# Patient Record
Sex: Female | Born: 2000 | Race: White | Hispanic: No | Marital: Single | Smoking: Never smoker
Health system: Southern US, Community
[De-identification: ages and names within clinical notes are randomized; demographics above are authoritative.]

## PROBLEM LIST (undated history)

## (undated) DIAGNOSIS — N946 Dysmenorrhea, unspecified: Secondary | ICD-10-CM

## (undated) DIAGNOSIS — F633 Trichotillomania: Secondary | ICD-10-CM

## (undated) DIAGNOSIS — J189 Pneumonia, unspecified organism: Secondary | ICD-10-CM

## (undated) DIAGNOSIS — F419 Anxiety disorder, unspecified: Secondary | ICD-10-CM

## (undated) DIAGNOSIS — R011 Cardiac murmur, unspecified: Secondary | ICD-10-CM

## (undated) DIAGNOSIS — D8989 Other specified disorders involving the immune mechanism, not elsewhere classified: Secondary | ICD-10-CM

## (undated) DIAGNOSIS — F32A Depression, unspecified: Secondary | ICD-10-CM

## (undated) DIAGNOSIS — F329 Major depressive disorder, single episode, unspecified: Secondary | ICD-10-CM

## (undated) HISTORY — DX: Other specified disorders involving the immune mechanism, not elsewhere classified: D89.89

## (undated) HISTORY — DX: Pneumonia, unspecified organism: J18.9

## (undated) HISTORY — DX: Cardiac murmur, unspecified: R01.1

## (undated) HISTORY — DX: Depression, unspecified: F32.A

## (undated) HISTORY — PX: NO PAST SURGERIES: SHX2092

## (undated) HISTORY — DX: Trichotillomania: F63.3

## (undated) HISTORY — PX: WISDOM TOOTH EXTRACTION: SHX21

## (undated) HISTORY — DX: Anxiety disorder, unspecified: F41.9

## (undated) HISTORY — DX: Dysmenorrhea, unspecified: N94.6

---
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
## 2020-01-10 ENCOUNTER — Ambulatory Visit (INDEPENDENT_AMBULATORY_CARE_PROVIDER_SITE_OTHER): Payer: PRIVATE HEALTH INSURANCE | Admitting: Psychiatry

## 2020-01-10 DIAGNOSIS — F411 Generalized anxiety disorder: Secondary | ICD-10-CM | POA: Diagnosis not present

## 2020-01-10 NOTE — Progress Notes (Signed)
[facility] Counselor/Therapist Progress Note  Patient ID: Casey Santana, MRN: [id number],    Date: 01/10/2020  Time Spent: 60 minutes   1:00pm to 2:00pm  Virtual Visit Note via Copywriter, advertising with patient by a video enabled telemedicine/telehealth application or telephone, with their informed consent, and verified patient privacy and that I am speaking with the correct person using two identifiers. I discussed the limitations, risks, security and privacy concerns of performing psychotherapy and management service by telephone and the availability of in person appointments. I also discussed with the patient that there may be a patient responsible charge related to this service. The patient expressed understanding and agreed to proceed. I discussed the treatment planning with the patient. The patient was provided an opportunity to ask questions and all were answered. The patient agreed with the plan and demonstrated an understanding of the instructions. The patient was advised to call  our office if  symptoms worsen or feel they are in a crisis state and need immediate contact.   Therapist Location: [facility] Patient Location: home   Treatment Type: Individual Therapy  Reported Symptoms: depression, anxiety, some lack of focus but thinks it's due to her allergies  Mental Status Exam:  Appearance:   Casual     Behavior:  Appropriate and Sharing  Motor:  Normal  Speech/Language:   Clear and Coherent  Affect:  anxious, depressed (lessened some)  Mood:  anxious and depressed  Thought process:  goal directed  Thought content:    WNL  Sensory/Perceptual disturbances:    WNL  Orientation:  oriented to person, place, time/date, situation, day of week, month of year and year  Attention:  Fair  Concentration:  Fair  Memory:  WNL  Fund of knowledge:   Good  Insight:    Good and Fair  Judgment:   Good and Fair  Impulse Control:  Good and Fair   Risk  Assessment: Danger to Self:  No Self-injurious Behavior: No Danger to Others: No Duty to Warn:no Physical Aggression / Violence:No  Access to Firearms a concern: No  Gang Involvement:No    Subjective: Patient reports anxiety and depression but feels she has tried to manage her thoughts and feelings better.  No incidents of "cutting self" since last 2 appts.  Interventions: Cognitive Behavioral Therapy and Solution-Oriented/Positive Psychology  Diagnosis:   ICD-10-CM   1. Generalized anxiety disorder  F41.1     Plan of Care: Patient not signing tx plan on computer screen due to Covid.  Treatment Goals: Goal remain on tx plan as patient works with strategies to achieve her goals. Progress is noted each visit in "Progress" section of Plan.  Long term goal: Develop healthy cognitive patterns and beliefs about self and the world that help alleviate depression and any thoughts to harm self, and also help prevent relapse of depression.  Short term goal: Identify and replace depressive thinking that leads to depressive feelings and actions.   Strategy: Identify depressive/anxious/negative thoughts and self-talk that lead to depression/anxiety and work interrupt those cognitions and replace with more reality-based, positive, and empowering thoughts and self-talk.  Progress: Patient reports anxiety and depression most recently with depression being the strongest, "but not as bad as it was previously." Denies any SI and has had no incidents of self-cutting since last 2 appts. States she is feeling more confident in her ability to stop the cutting as she describes her recent success.  Having some allergy issues more recently. Patient needing to  talk today about increased stress and anxiety on her job, some challenges with specific employees and explained her concerns. 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 It was a good sign to me that patient brought this up and she will be following through in working to stop it more quickly as she did successfully in the past with these behaviors.  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  Self-esteem she rates a 3-4 on a scale of 1-10, which remains good progress for patient when compared with her history of ratings.  Still smiling more and having more self-confidence in speaking up.  Is putting forth more effort and the progress is showing.  Reminded patient of something that came up in last session which was "progress is not linear", and encouraging her to feel good about the progress she is experiencing.  Patient to continue goal-directed behavior between sessions including positive self-care of looking more for the positives than the negatives, refraining from any self-cutting, increasing her belief in herself, maintaining contact with supportive people, identifying her strengths, getting outside some each day, healthy nutrition and exercise, staying in the present rather than jumping ahead to the future, focusing on what she can control versus cannot control, and using more positive self-talk.  Goal review and progress/challenges noted with patient.  Next appointment within 2-3 weeks.   Mathis Fare, LCSW

## 2020-01-24 ENCOUNTER — Other Ambulatory Visit: Payer: Self-pay

## 2020-01-24 ENCOUNTER — Other Ambulatory Visit (HOSPITAL_COMMUNITY)
Admission: RE | Admit: 2020-01-24 | Discharge: 2020-01-24 | Disposition: A | Discharge status: Home / Self Care | Payer: PRIVATE HEALTH INSURANCE | Source: Ambulatory Visit | Attending: Physician Assistant | Admitting: Physician Assistant

## 2020-01-24 ENCOUNTER — Encounter: Payer: Self-pay | Admitting: Physician Assistant

## 2020-01-24 ENCOUNTER — Ambulatory Visit (INDEPENDENT_AMBULATORY_CARE_PROVIDER_SITE_OTHER): Payer: No Typology Code available for payment source | Admitting: Physician Assistant

## 2020-01-24 ENCOUNTER — Ambulatory Visit (INDEPENDENT_AMBULATORY_CARE_PROVIDER_SITE_OTHER): Payer: PRIVATE HEALTH INSURANCE | Admitting: Psychiatry

## 2020-01-24 VITALS — BP 110/56 | HR 84 | Temp 97.9°F | Ht 65.0 in | Wt 128.4 lb

## 2020-01-24 DIAGNOSIS — N760 Acute vaginitis: Secondary | ICD-10-CM | POA: Diagnosis not present

## 2020-01-24 DIAGNOSIS — F411 Generalized anxiety disorder: Secondary | ICD-10-CM | POA: Diagnosis not present

## 2020-01-24 NOTE — Progress Notes (Signed)
[facility] Counselor/Therapist Progress Note  Patient ID: Casey Santana, MRN: [id number],    Date: 01/24/2020  Time Spent: 60 minutes   1:00pm to 2:00pm  Treatment Type: Individual Therapy  Reported Symptoms: anxiety, depression. States anxiety is the stronger symptom.  Mental Status Exam:  Appearance:   Casual     Behavior:  Appropriate, Sharing and Motivated  Motor:  Normal  Speech/Language:   Clear and Coherent  Affect:  anxious, depression  Mood:  anxious  Thought process:  goal directed  Thought content:    WNL  Sensory/Perceptual disturbances:    WNL  Orientation:  oriented to person, place, time/date, situation, day of week, month of year and year  Attention:  Fair  Concentration:  Fair  Memory:  WNL  Fund of knowledge:   Good  Insight:    Fair  Judgment:   Fair  Impulse Control:  Fair   Risk Assessment: Danger to Self:  No Self-injurious Behavior: No Danger to Others: No Duty to Warn:no Physical Aggression / Violence:No  Access to Firearms a concern: No  Gang Involvement:No   Subjective: Patient today reporting anxiety and depression. Busy in her job with more stress, as she got a job that involve more responsibility and management.   Interventions: Cognitive Behavioral Therapy and Solution-Oriented/Positive Psychology  Diagnosis:   ICD-10-CM   1. Generalized anxiety disorder  F41.1      Plan of Care: Patient not signing tx plan on computer screen due to Covid.  Treatment Goals: Goal remain on tx plan as patient works with strategies to achieve her goals. Progress is noted each visit in "Progress" section of Plan.  Long term goal: Develop healthy cognitive patterns and beliefs about self and the world that help alleviate depression/anxiety and any thoughts to harm self, and also help prevent relapse of depression.  Short term goal: Identify and replace depressive thinking that leads to depressive/anxious feelings and actions.    Strategy: Identify depressive/anxious/negative thoughts and self-talk that lead to depression/anxiety and work interrupt those cognitions and replace with more reality-based, positive, and empowering thoughts and self-talk.  Progress: Patient in today reporting anxiety and depression, with anxiety being the strongest. Currently her anxiety has gotten worse due to work and family situations.  Patient processed some of the family situations, her anxious thoughts and feelings about them, and we looked at some potential ways of her better managing what she feels is her part in those situations.  (Not all details shared in this note due to patient privacy needs.)  Patient has been increasingly more open about some of her own personal issues and also how they impact the family and patient.  She states that her depression has decreased some since last appointment.  With her anxiety having increased recently due to personal and family issues, we worked with her short-term goal and strategy and treatment plan above, aimed at more quickly interrupting and challenging her anxious thoughts and self talk, then replacing them with more realistic, encouraging, and empowering thoughts and self talk.  She rated her self-esteem today as a definite for on a scale of 1-10 which is a little better than last session.  Observed more smiling occasionally today even though we were talking about some sensitive information.  She currently denies any SI and also denies any incidents of self-cutting since last appointment.  This translates into her being free of any self-cutting for over the past 3 appointments. Seems to be feeling more confident in herself  and sharing information more openly.  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  She is to continue working with  specially and also behaviors involving positive self-care, including focus what she can control versus cannot, healthy nutrition and exercise, having contact with supportive people often, being able to identify and use her strengths more, making her self- talk more positive, looking for more positives than negatives each day, working on her self-esteem and belief in herself, staying in the present, and continue to refrain from any self-cutting.  Goal review and progress/challenges noted with patient.  Next appointment within 3 weeks.   Mathis Fare, LCSW

## 2020-01-24 NOTE — Patient Instructions (Signed)
It was great to see you!  I will be in touch with your swab results as soon as they have returned.  Take care,  Jarold Motto PA-C

## 2020-01-24 NOTE — Progress Notes (Signed)
Casey Santana is a 19 y.o. adult here for a new problem.  I acted as a Neurosurgeon for [employer], PA-C Corky Mull, LPN   History of Present Illness:   Chief Complaint  Patient presents with  . Vaginal Discharge    HPI   Vaginal discharge Pt c/o white vaginal discharge with external itching x 10 days. Has not tried anything OTC. Denies odor, pelvic pain, pain with sex, partner with similar symptoms.  She is sexually active with one female partner, uses condoms.  Patient's last menstrual period was 12/30/2019.   Past Medical History:  Diagnosis Date  . Anxiety   . Depression   . Dysmenorrhea   . Heart murmur      Social History   Tobacco Use  . Smoking status: Never Smoker  . Smokeless tobacco: Never Used  Vaping Use  . Vaping Use: Never used  Substance Use Topics  . Alcohol use: No  . Drug use: No    Past Surgical History:  Procedure Laterality Date  . NO PAST SURGERIES      Family History  Problem Relation Age of Onset  . Arthritis Maternal Grandmother   . Hearing loss Maternal Grandmother   . Hyperlipidemia Maternal Grandmother   . Hypertension Maternal Grandmother   . Anxiety disorder Maternal Grandmother   . Arthritis Maternal Grandfather   . Cancer Maternal Grandfather   . Depression Maternal Grandfather   . Hearing loss Maternal Grandfather   . Hyperlipidemia Maternal Grandfather   . Hyperlipidemia Paternal Grandmother   . Hyperlipidemia Paternal Grandfather   . Mental retardation Mother        bipolar disorder  . Arthritis Mother   . Hyperlipidemia Father   . Bipolar disorder Other   . Schizophrenia Other   . Migraines Neg Hx   . Seizures Neg Hx   . Autism Neg Hx   . ADD / ADHD Neg Hx     Allergies  Allergen Reactions  . Peanut-Containing Drug Products     Current Medications:   Current Outpatient Medications:  .  amphetamine-dextroamphetamine (ADDERALL) 30 MG tablet, Take 1 tablet by mouth daily., Disp: 30 tablet, Rfl: 0 .   EPINEPHrine (EPIPEN 2-PAK) 0.3 mg/0.3 mL IJ SOAJ injection, Inject 0.3 mLs (0.3 mg total) into the muscle as needed for anaphylaxis., Disp: 1 each, Rfl: 2 .  propranolol (INDERAL) 10 MG tablet, Take 1 tablet (10 mg total) by mouth 2 (two) times daily., Disp: 60 tablet, Rfl: 5   Review of Systems:   ROS  Negative unless otherwise specified per HPI.  Vitals:   Vitals:   01/24/20 0801  BP: (!) 110/56  Pulse: 84  Temp: 97.9 F (36.6 C)  TempSrc: Temporal  SpO2: 97%  Weight: 128 lb 6.1 oz (58.2 kg)  Height: 5\' 5"  (1.651 m)     Body mass index is 21.36 kg/m.  Physical Exam:   Physical Exam Chaperone present: , LPN.  Constitutional:      Appearance: Casey Row "Micah" is well-developed.  HENT:     Head: Normocephalic and atraumatic.  Eyes:     Conjunctiva/sclera: Conjunctivae normal.  Pulmonary:     Effort: Pulmonary effort is normal.  Genitourinary:    Vagina: Vaginal discharge (yellow/green, thick) present.     Cervix: Normal.  Musculoskeletal:        General: Normal range of motion.     Cervical back: Normal range of motion and neck supple.  Skin:    General: Skin is warm  and dry.  Neurological:     Mental Status: Casey Ruelas "Micah" is alert and oriented to person, place, and time.  Psychiatric:        Behavior: Behavior normal.        Thought Content: Thought content normal.        Judgment: Judgment normal.     Assessment and Plan:   Casey Santana was seen today for vaginal discharge.  Diagnoses and all orders for this visit:  Acute vaginitis -     Cervicovaginal ancillary only    Vaginal swab sent off for evaluation. Recommend avoiding sex until results have returned. Will recommend appropriate treatment once results have returned.   CMA or LPN served as scribe during this visit. History, Physical, and Plan performed by medical provider. The above documentation has been reviewed and is accurate and complete.   Casey Motto, PA-C

## 2020-01-25 ENCOUNTER — Other Ambulatory Visit: Payer: Self-pay | Admitting: Physician Assistant

## 2020-01-25 LAB — CERVICOVAGINAL ANCILLARY ONLY
Bacterial Vaginitis (gardnerella): NEGATIVE
Candida Glabrata: NEGATIVE
Candida Vaginitis: POSITIVE — AB
Chlamydia: NEGATIVE
Comment: NEGATIVE
Comment: NEGATIVE
Comment: NEGATIVE
Comment: NEGATIVE
Comment: NEGATIVE
Comment: NORMAL
Neisseria Gonorrhea: NEGATIVE
Trichomonas: NEGATIVE

## 2020-01-25 MED ORDER — FLUCONAZOLE 150 MG PO TABS
150.0000 mg | ORAL_TABLET | Freq: Once | ORAL | 1 refills | Status: AC
Start: 2020-01-25 — End: 2020-01-25

## 2020-02-06 ENCOUNTER — Ambulatory Visit (INDEPENDENT_AMBULATORY_CARE_PROVIDER_SITE_OTHER): Payer: PRIVATE HEALTH INSURANCE | Admitting: Adult Health

## 2020-02-06 ENCOUNTER — Encounter: Payer: Self-pay | Admitting: Adult Health

## 2020-02-06 ENCOUNTER — Other Ambulatory Visit: Payer: Self-pay

## 2020-02-06 VITALS — BP 100/61 | HR 90

## 2020-02-06 DIAGNOSIS — F909 Attention-deficit hyperactivity disorder, unspecified type: Secondary | ICD-10-CM | POA: Diagnosis not present

## 2020-02-06 DIAGNOSIS — F331 Major depressive disorder, recurrent, moderate: Secondary | ICD-10-CM | POA: Diagnosis not present

## 2020-02-06 DIAGNOSIS — F411 Generalized anxiety disorder: Secondary | ICD-10-CM | POA: Diagnosis not present

## 2020-02-06 MED ORDER — AMPHETAMINE-DEXTROAMPHETAMINE 30 MG PO TABS: 30.0000 mg | ORAL_TABLET | Freq: Every day | ORAL | 0 refills | Status: DC

## 2020-02-06 NOTE — Progress Notes (Signed)
Casey Santana [id number] 2001/02/28 19 y.o.  Subjective:   Patient ID:  Casey Santana is a 19 y.o. (DOB May 11, 2000) adult.  Chief Complaint: No chief complaint on file.   HPI Casey Santana presents to the office today for follow-up of MDD, ADHD, and GAD.  Describes mood today as "ok". Mood symptoms - reports decreased depression, anxiety, and irritability. Stating "I'm doing alright". Feels like change back  to Adderall IR has been a better fit. Stating "I feel like it lets me focus on things". Doing well in work setting - recent promotion. Seeing therapist Rockne Menghini. Improved interest and motivation. Taking medications as prescribed.  Energy levels stable. Active, does not have a regular exercise routine.  Enjoys some usual interests and activities. Single. Lives at home with parents and 2 dogs - "Delphos and Perlie Gold". . Has a 43 year old brother. Spending time with family.  Appetite adequate. Weight stable - 130 pounds. Sleeps well most nights. Averages 7 to 8 hours. Focus and concentration is "decent". Completing tasks. Managing aspects of household. Work going well. Works 25 to 30 hours a week at [employer].  Denies SI or HI.  Denies AH or VH. Denies substance use, over spending, or reckless behaviors.   Previous medication trials: Lexapro, Prozac, Propanolol, Cymbalta  GAD-7     Office Visit from 02/13/2019 in [facility] Visit from 02/24/2018 in [facility]  Total GAD-7 Score 14 9    PHQ2-9     Office Visit from 05/12/2019 in [facility] Visit from 04/27/2019 in [facility] Visit from 02/13/2019 in [facility] from 02/24/2018 in [facility] Office Visit from 11/27/2016 in [facility]  PHQ-2 Total Score 4 4 2 3 2   PHQ-9 Total Score 19 20 19 11 4         Review of Systems:  Review of Systems  Musculoskeletal: Negative for gait problem.  Neurological: Negative for tremors.  Psychiatric/Behavioral:       Please refer to HPI    Medications: I have reviewed the patient's current medications.  Current Outpatient Medications  Medication Sig Dispense Refill   amphetamine-dextroamphetamine (ADDERALL) 30 MG tablet Take 1 tablet by mouth daily. 30 tablet 0   EPINEPHrine (EPIPEN 2-PAK) 0.3 mg/0.3 mL IJ SOAJ injection Inject 0.3 mLs (0.3 mg total) into the muscle as needed for anaphylaxis. 1 each 2   propranolol (INDERAL) 10 MG tablet Take 1 tablet (10 mg total) by mouth 2 (two) times daily. 60 tablet 5   No current facility-administered medications for this visit.    Medication Side Effects: None  Allergies:  Allergies  Allergen Reactions   Peanut-Containing Drug Products     Past Medical History:  Diagnosis Date   Anxiety    Depression    Dysmenorrhea    Heart murmur     Family History  Problem Relation Age of Onset   Arthritis Maternal Grandmother    Hearing loss Maternal Grandmother    Hyperlipidemia Maternal Grandmother    Hypertension Maternal Grandmother    Anxiety disorder Maternal Grandmother    Arthritis Maternal Grandfather    Cancer Maternal Grandfather    Depression Maternal Grandfather    Hearing loss Maternal Grandfather    Hyperlipidemia Maternal Grandfather    Hyperlipidemia Paternal Grandmother    Hyperlipidemia Paternal Grandfather    Mental retardation Mother        bipolar disorder  Arthritis Mother    Hyperlipidemia Father    Bipolar disorder Other    Schizophrenia Other    Migraines Neg Hx    Seizures Neg Hx    Autism Neg Hx    ADD / ADHD Neg Hx     Social History   Socioeconomic History   Marital status: Single    Spouse name: Not on file   Number of children: Not on file   Years of education: Not on file   Highest education level: Not on file   Occupational History   Not on file  Tobacco Use   Smoking status: Never Smoker   Smokeless tobacco: Never Used  Vaping Use   Vaping Use: Never used  Substance and Sexual Activity   Alcohol use: No   Drug use: No   Sexual activity: Yes    Birth control/protection: Condom  Other Topics Concern   Not on file  Social History Narrative   Lives with mom, dad and brother when he comes home from college. She plans on attending [employer]   Social Determinants of Health   Financial Resource Strain:    Difficulty of Paying Living Expenses: Not on file  Food Insecurity:    Worried About Programme researcher, broadcasting/film/video in the Last Year: Not on file   [employer] of Food in the Last Year: Not on file  Transportation Needs:    Lack of Transportation (Medical): Not on file   Lack of Transportation (Non-Medical): Not on file  Physical Activity:    Days of Exercise per Week: Not on file   Minutes of Exercise per Session: Not on file  Stress:    Feeling of Stress : Not on file  Social Connections:    Frequency of Communication with Friends and Family: Not on file   Frequency of Social Gatherings with Friends and Family: Not on file   Attends Religious Services: Not on file   Active Member of Clubs or Organizations: Not on file   Attends Banker Meetings: Not on file   Marital Status: Not on file  Intimate Partner Violence:    Fear of Current or Ex-Partner: Not on file   Emotionally Abused: Not on file   Physically Abused: Not on file   Sexually Abused: Not on file    Past Medical History, Surgical history, Social history, and Family history were reviewed and updated as appropriate.   Please see review of systems for further details on the patient's review from today.   Objective:   Physical Exam:  There were no vitals taken for this visit.  Physical Exam Constitutional:      General: Casey Santana "Casey Santana" is not in acute distress. Musculoskeletal:         General: No deformity.  Neurological:     Mental Status: Casey Santana "Casey Santana" is alert and oriented to person, place, and time.     Coordination: Coordination normal.  Psychiatric:        Attention and Perception: Attention and perception normal. Trent Juday "Casey Santana" does not perceive auditory or visual hallucinations.        Mood and Affect: Mood normal. Mood is not anxious or depressed. Affect is not labile, blunt, angry or inappropriate.        Speech: Speech normal.        Behavior: Behavior normal.        Thought Content: Thought content normal. Thought content is not paranoid or delusional. Thought content does not include homicidal  or suicidal ideation. Thought content does not include homicidal or suicidal plan.        Cognition and Memory: Cognition and memory normal.        Judgment: Judgment normal.     Comments: Insight intact     Lab Review:     Component Value Date/Time   NA 137 02/13/2019 1117   K 3.9 02/13/2019 1117   CL 102 02/13/2019 1117   CO2 26 02/13/2019 1117   GLUCOSE 93 02/13/2019 1117   BUN 9 02/13/2019 1117   CREATININE 0.68 02/13/2019 1117   CALCIUM 9.6 02/13/2019 1117       Component Value Date/Time   WBC 3.8 (L) 02/13/2019 1117   RBC 4.53 02/13/2019 1117   HGB 13.8 02/13/2019 1117   HCT 40.9 02/13/2019 1117   PLT 219.0 02/13/2019 1117   MCV 90.4 02/13/2019 1117   MCHC 33.6 02/13/2019 1117   RDW 12.9 02/13/2019 1117   LYMPHSABS 1.6 02/13/2019 1117   MONOABS 0.3 02/13/2019 1117   EOSABS 0.1 02/13/2019 1117   BASOSABS 0.1 02/13/2019 1117    No results found for: POCLITH, LITHIUM   No results found for: PHENYTOIN, PHENOBARB, VALPROATE, CBMZ   .res Assessment: Plan:    Plan:  PDMP reviewed  1. Adderall 30mg  daily. 2. Propanolol 10mg  BID for anxiety  40/58 Attention Deficit Disorder likely Meets DSM 5 criteria for ADHD  RTC 4 weeks  Continue therapy with  Patient advised to contact office with any questions,  adverse effects, or acute worsening in signs and symptoms.  Greater than 50% of face to face time with patient was spent on counseling and coordination of care. We discussed medications, diet, vitamin supplementation, and therapy. Advised to review the ADD[employer] website.    Diagnoses and all orders for this visit:  Generalized anxiety disorder  Attention deficit hyperactivity disorder (ADHD), unspecified ADHD type  Major depressive disorder, recurrent episode, moderate (HCC)     Please see After Visit Summary for patient specific instructions.  Future Appointments  Date Time Provider Department Center  02/07/2020  1:00 PM Rockne Menghini, LCSW [facility] None  02/28/2020  3:00 PM Mathis Fare, LCSW [facility] None  03/20/2020  1:00 PM Mathis Fare, LCSW [facility] None  05/23/2020  8:30 AM Mathis Fare, MD [facility]    No orders of the defined types were placed in this encounter.   -------------------------------

## 2020-02-07 ENCOUNTER — Ambulatory Visit (INDEPENDENT_AMBULATORY_CARE_PROVIDER_SITE_OTHER): Payer: PRIVATE HEALTH INSURANCE | Admitting: Psychiatry

## 2020-02-07 DIAGNOSIS — F411 Generalized anxiety disorder: Secondary | ICD-10-CM | POA: Diagnosis not present

## 2020-02-07 NOTE — Progress Notes (Signed)
[facility] Counselor/Therapist Progress Note  Patient ID: Casey Santana, MRN: [id number],    Date: 02/07/2020  Time Spent: 50 minutes  1:00pm to 1:50pm   Treatment Type: Individual Therapy  Reported Symptoms: anxiety, depression, lack of focus but "often forgets to take my ADHD meds"  Mental Status Exam:  Appearance:   Casual     Behavior:  Appropriate, Sharing and Motivated  Motor:  Normal  Speech/Language:   Clear and Coherent  Affect:  anxious  Mood:  anxious/depressed  Thought process:  goal directed  Thought content:    WNL  Sensory/Perceptual disturbances:    WNL  Orientation:  oriented to person, place, time/date, situation, day of week, month of year and year  Attention:  Good  Concentration:  Good and Fair  Memory:  WNL  Fund of knowledge:   Good  Insight:    Fair  Judgment:   Fair  Impulse Control:  Fair   Risk Assessment: Danger to Self:  No Self-injurious Behavior: No Danger to Others: No Duty to Warn:no Physical Aggression / Violence:No  Access to Firearms a concern: No  Gang Involvement:No   Subjective: Patient today reports anxiety and some focus issues but admits she's missed some of her ADHD meds.  Interventions: Cognitive Behavioral Therapy and Solution-Oriented/Positive Psychology  Diagnosis:   ICD-10-CM   1. Generalized anxiety disorder  F41.1      Plan of Care: Patient not signing tx plan on computer screen due to Covid.  Treatment Goals: Goal remain on tx plan as patient works with strategies to achieve her goals. Progress is noted each visit in "Progress" section of Plan.  Long term goal: Develop healthy cognitive patterns and beliefs about self and the world that help alleviate depression/anxiety and any thoughts to harm self, and also help prevent relapse of depression.  Short term goal: Identify and replace depressive thinking that leads to depressive/anxious feelings and actions.   Strategy: Identify  depressive/anxious/negative thoughts and self-talk that lead to depression/anxiety and work interrupt those cognitions and replace with more reality-based, positive, and empowering thoughts and self-talk.  Progress: Patient in today reporting anxiety, some depression although it has decreased a little more, and lack of focus at times due to forgetting her ADHD meds.  Has continued to not do any cutting on herself which is really good for her.  Seems to feel positive about this as this is the longest she has gone in quite a while without cutting, per her report.  Discussed the need to be more regular with the ADHD meds and she agrees. Concerned and very anxious about not knowing what she wants to do as a career despite talking to several people. Also very sensitive about speaking with family about a very personal issue and she is afraid of their response and possible judgement or insensitivity. Patient was able to be quite open about the the career issue and the more personal issue today. Processed the personal issue and her fears about parent's reactions (Not all information included in this note due to patient privacy concerns.)  Patient did seem less anxious and more grounded after talking through the personal issue more and has some tentative plans for addressing it with her parents soon.  Self-esteem rating today on 1-10 scale is a "4-5".  We also discussed the career issue and her wanting to determine where her interest and strengths lie, and determine a plan of action.  Was able to come up with some steps that might  help her and getting answers to these issues and also exploring the issue of talking with a career specialist.  Some increased confidence noted in patient.  Patient encouraged to continue maintaining contact with supportive people, healthy nutrition and exercise, start taking her ADHD meds consistently, using encouraging self talk, focus on what she can control or impact versus what she cannot,  clearer identification of her strengths and using them more, working to elevate her self-esteem and belief in herself more, staying in the present, looking for more positives and negatives every day, and continuing to refrain from any self cutting.  We will follow-up on issues from today again at next appointment.  Goal review and progress/challenges noted with patient.  Next appointment within 3 weeks.   Mathis Fare, LCSW

## 2020-02-28 ENCOUNTER — Ambulatory Visit: Payer: PRIVATE HEALTH INSURANCE | Admitting: Psychiatry

## 2020-03-12 ENCOUNTER — Ambulatory Visit: Payer: PRIVATE HEALTH INSURANCE | Admitting: Adult Health

## 2020-03-13 ENCOUNTER — Encounter: Payer: Self-pay | Admitting: Adult Health

## 2020-03-14 ENCOUNTER — Other Ambulatory Visit: Payer: Self-pay

## 2020-03-14 DIAGNOSIS — Z20822 Contact with and (suspected) exposure to covid-19: Secondary | ICD-10-CM

## 2020-03-17 LAB — NOVEL CORONAVIRUS, NAA: SARS-CoV-2, NAA: NOT DETECTED

## 2020-03-20 ENCOUNTER — Other Ambulatory Visit: Payer: Self-pay

## 2020-03-20 ENCOUNTER — Ambulatory Visit (INDEPENDENT_AMBULATORY_CARE_PROVIDER_SITE_OTHER): Payer: BC Managed Care – PPO | Admitting: Psychiatry

## 2020-03-20 DIAGNOSIS — F411 Generalized anxiety disorder: Secondary | ICD-10-CM | POA: Diagnosis not present

## 2020-03-20 NOTE — Progress Notes (Signed)
[facility] Counselor/Therapist Progress Note  Patient ID: SOLSTICE LASTINGER, MRN: [id number],    Date: 03/20/2020  Time Spent: 60 minutes   1:00pm to 2:00pm  Treatment Type: Individual Therapy  Reported Symptoms: anxiety, some depression, family issues  Mental Status Exam:  Appearance:   Casual     Behavior:  Appropriate, Sharing and Motivated  Motor:  Normal  Speech/Language:   Normal Rate  Affect:  anxious, some depression  Mood:  anxious and depressed  Thought process:  normal  Thought content:    WNL  Sensory/Perceptual disturbances:    WNL  Orientation:  oriented to person, place, time/date, situation, day of week, month of year and year  Attention:  Fair  Concentration:  Fair  Memory:  WNL  Fund of knowledge:   Good  Insight:    Fair  Judgment:   Fair  Impulse Control:  Fair   Risk Assessment: Danger to Self:  No Self-injurious Behavior: No Danger to Others: No Duty to Warn:no Physical Aggression / Violence:No  Access to Firearms a concern: No  Gang Involvement:No   Subjective: Patient today reports anxiety and some depression.  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   Some family issues Covid exposures.  Interventions: Cognitive Behavioral Therapy and Solution-Oriented/Positive Psychology  Diagnosis:   ICD-10-CM   1. Generalized anxiety disorder  F41.1      Plan of Care: Patient not signing tx plan on computer screen due to Covid.  Treatment Goals: Goal remain on tx plan as patient works with strategies to achieve her goals. Progress is noted each visit in "Progress" section of Plan.  Long term goal: Develop healthy cognitive patterns and beliefs about self and the world that help alleviate depression/anxietyand any thoughts to harm self, and also help prevent relapse of depression.  Short term goal: Identify and replace depressive thinking that leads to depressive/anxiousfeelings and actions.   Strategy: Identify  depressive/anxious/negative thoughts and self-talk that lead to depression/anxiety and work interrupt those cognitions and replace with more reality-based, positive, and empowering thoughts and self-talk.  Progress: Patient today reports anxiety and some depression and family issues over holidays. Work concerns. Admits to some thoughts of cutting but did not act on any of them." Playing games with friends on the phone helps distract from those thoughts."  Talked about this being the longest stretch of time that she has had in refraining from cutting, which is 99 days very proud of it.  States that she has not had any recent thoughts of cutting self and there seems to be more time in between each occurrence of those thoughts.  Commits to continue to work on this and not to be cutting herself.  Also commits to giving updates each session.Marland Kitchen Has been working on and shared a letter she is preparing for her parents re: "who I am, identity issues" Shared her lengthy, very carefully and respectfully written letter in session. Letter was very sensitive for patient and also with a lot of sensitivity for her parents. Patient became more emotional as she shared her letter today.  Processed her many thoughts and feelings she has had while formulating the letter and to this point. Anxiety has risen, and dealing with nervousness and the uncertainties involved in sharing letter with parents. She does feel she is doing the right thing and is handling this in the best way she knows.  She acknowledges that a lot of her anxiety and depression is related to her  identity issues and the internal conflicts that she has felt, and also the aloneness she has felt within the family.  Noticeable increase in her self-confidence and willingness to take a stand for herself, and a very respectful way with her parents, that may lead to much greater self acceptance and self-esteem for patient.  Courage patient to practice positive and affirming  self talk, remain on all medications as prescribed, stay in the present and focus on what she can control versus cannot control, believing in herself more, continue to refrain from any self cutting, be more intentional and looking for positives every day, feel good about the strength she is showing in some of her behaviors, and remain in contact with people that are supportive of her.  Goal review and progress/challenges noted with patient.  Next appointment within 2 weeks.   Mathis Fare, LCSW

## 2020-03-22 ENCOUNTER — Ambulatory Visit (INDEPENDENT_AMBULATORY_CARE_PROVIDER_SITE_OTHER): Payer: BC Managed Care – PPO | Admitting: Adult Health

## 2020-03-22 ENCOUNTER — Encounter: Payer: Self-pay | Admitting: Adult Health

## 2020-03-22 ENCOUNTER — Other Ambulatory Visit: Payer: Self-pay

## 2020-03-22 DIAGNOSIS — F411 Generalized anxiety disorder: Secondary | ICD-10-CM | POA: Diagnosis not present

## 2020-03-22 DIAGNOSIS — F909 Attention-deficit hyperactivity disorder, unspecified type: Secondary | ICD-10-CM | POA: Diagnosis not present

## 2020-03-22 DIAGNOSIS — F331 Major depressive disorder, recurrent, moderate: Secondary | ICD-10-CM | POA: Diagnosis not present

## 2020-03-22 MED ORDER — PROPRANOLOL HCL 10 MG PO TABS: 10.0000 mg | ORAL_TABLET | Freq: Two times a day (BID) | ORAL | 5 refills | Status: DC

## 2020-03-22 MED ORDER — AMPHETAMINE-DEXTROAMPHETAMINE 30 MG PO TABS: 30.0000 mg | ORAL_TABLET | Freq: Every day | ORAL | 0 refills | Status: DC

## 2020-03-22 NOTE — Progress Notes (Signed)
Casey Santana [id number] 12-07-2000 20 y.o.  Subjective:   Patient ID:  Casey Santana is a 20 y.o. (DOB August 19, 2000) adult.  Chief Complaint: No chief complaint on file.   HPI Casey Santana presents to the office today for follow-up of MDD, ADHD, and GAD.  Describes mood today as "ok". Mood symptoms - reports decreased depression, anxiety, and irritability. Stating "I'm doing good". Feels like medications are working well. Doing well in work setting - works closing shifts. Seeing therapist [name]. Improved interest and motivation. Taking medications as prescribed.  Energy levels stable. Active, does not have a regular exercise routine.  Enjoys some usual interests and activities. Single. Lives at home with parents and 2 dogs - "Le Grand and Joneen Caraway". Has a 68 year old brother. Spending time with family.  Appetite adequate. Weight stable - 130 pounds. Sleeps well most nights. Averages 8 to 9 hours. Focus and concentration stable. Completing tasks. Managing aspects of household. Work going well. Works 25 to 30 hours a week at [employer].  Denies SI or HI.  Denies AH or VH. Denies substance use, over spending, or reckless behaviors.   Previous medication trials: Lexapro, Prozac, Propanolol, Cymbalta    GAD-7   Flowsheet Row Office Visit from 02/13/2019 in [facility] Visit from 02/24/2018 in [facility]  Total GAD-7 Score 14 9    PHQ2-9   [place] Office Visit from 05/12/2019 in [facility] Visit from 04/27/2019 in [facility] Visit from 02/13/2019 in [facility] Visit from 02/24/2018 in [facility] Visit from 11/27/2016 in [facility]  PHQ-2 Total Score 4 4 2 3 2   PHQ-9 Total Score 19 20 19 11 4        Review of Systems:  Review of Systems   Musculoskeletal: Negative for gait problem.  Neurological: Negative for tremors.  Psychiatric/Behavioral:       Please refer to HPI    Medications: I have reviewed the patient's current medications.  Current Outpatient Medications  Medication Sig Dispense Refill  . [START ON 05/17/2020] amphetamine-dextroamphetamine (ADDERALL) 30 MG tablet Take 1 tablet by mouth daily. 30 tablet 0  . amphetamine-dextroamphetamine (ADDERALL) 30 MG tablet Take 1 tablet by mouth daily. 30 tablet 0  . [START ON 04/19/2020] amphetamine-dextroamphetamine (ADDERALL) 30 MG tablet Take 1 tablet by mouth daily. 30 tablet 0  . EPINEPHrine (EPIPEN 2-PAK) 0.3 mg/0.3 mL IJ SOAJ injection Inject 0.3 mLs (0.3 mg total) into the muscle as needed for anaphylaxis. 1 each 2  . propranolol (INDERAL) 10 MG tablet Take 1 tablet (10 mg total) by mouth 2 (two) times daily. 60 tablet 5   No current facility-administered medications for this visit.    Medication Side Effects: None  Allergies:  Allergies  Allergen Reactions  . Peanut-Containing Drug Products     Past Medical History:  Diagnosis Date  . Anxiety   . Depression   . Dysmenorrhea   . Heart murmur     Family History  Problem Relation Age of Onset  . Arthritis Maternal Grandmother   . Hearing loss Maternal Grandmother   . Hyperlipidemia Maternal Grandmother   . Hypertension Maternal Grandmother   . Anxiety disorder Maternal Grandmother   . Arthritis Maternal Grandfather   . Cancer Maternal Grandfather   . Depression Maternal Grandfather   . Hearing loss Maternal Grandfather   . Hyperlipidemia Maternal Grandfather   .  Hyperlipidemia Paternal Grandmother   . Hyperlipidemia Paternal Grandfather   . Mental retardation Mother        bipolar disorder  . Arthritis Mother   . Hyperlipidemia Father   . Bipolar disorder Other   . Schizophrenia Other   . Migraines Neg Hx   . Seizures Neg Hx   . Autism Neg Hx   . ADD / ADHD Neg Hx     Social History    Socioeconomic History  . Marital status: Single    Spouse name: Not on file  . Number of children: Not on file  . Years of education: Not on file  . Highest education level: Not on file  Occupational History  . Not on file  Tobacco Use  . Smoking status: Never Smoker  . Smokeless tobacco: Never Used  Vaping Use  . Vaping Use: Never used  Substance and Sexual Activity  . Alcohol use: No  . Drug use: No  . Sexual activity: Yes    Birth control/protection: Condom  Other Topics Concern  . Not on file  Social History Narrative   ** Merged History Encounter **       Lives with mom, dad and brother when he comes home from college. She plans on attending [employer]   Social Determinants of Health   Financial Resource Strain: Not on file  Food Insecurity: Not on file  Transportation Needs: Not on file  Physical Activity: Not on file  Stress: Not on file  Social Connections: Not on file  Intimate Partner Violence: Not on file    Past Medical History, Surgical history, Social history, and Family history were reviewed and updated as appropriate.   Please see review of systems for further details on the patient's review from today.   Objective:   Physical Exam:  There were no vitals taken for this visit.  Physical Exam Constitutional:      General: [name] is not in acute distress. Musculoskeletal:        General: No deformity.  Neurological:     Mental Status: Casey Santana is alert and oriented to person, place, and time.     Coordination: Coordination normal.  Psychiatric:        Attention and Perception: Attention and perception normal. Casey Santana does not perceive auditory or visual hallucinations.        Mood and Affect: Mood normal. Mood is not anxious or depressed. Affect is not labile, blunt, angry or inappropriate.        Speech: Speech normal.        Behavior: Behavior normal.        Thought Content: Thought content normal. Thought content  is not paranoid or delusional. Thought content does not include homicidal or suicidal ideation. Thought content does not include homicidal or suicidal plan.        Cognition and Memory: Cognition and memory normal.        Judgment: Judgment normal.     Comments: Insight intact     Lab Review:     Component Value Date/Time   NA 137 02/13/2019 1117   K 3.9 02/13/2019 1117   CL 102 02/13/2019 1117   CO2 26 02/13/2019 1117   GLUCOSE 93 02/13/2019 1117   BUN 9 02/13/2019 1117   CREATININE 0.68 02/13/2019 1117   CALCIUM 9.6 02/13/2019 1117       Component Value Date/Time   WBC 3.8 (L) 02/13/2019 1117   RBC 4.53 02/13/2019 1117  HGB 13.8 02/13/2019 1117   HCT 40.9 02/13/2019 1117   PLT 219.0 02/13/2019 1117   MCV 90.4 02/13/2019 1117   MCHC 33.6 02/13/2019 1117   RDW 12.9 02/13/2019 1117   LYMPHSABS 1.6 02/13/2019 1117   MONOABS 0.3 02/13/2019 1117   EOSABS 0.1 02/13/2019 1117   BASOSABS 0.1 02/13/2019 1117    No results found for: POCLITH, LITHIUM   No results found for: PHENYTOIN, PHENOBARB, VALPROATE, CBMZ   .res Assessment: Plan:    Plan:  PDMP reviewed  1. Adderall 30mg  daily. 2. Propanolol 10mg  BID for anxiety  40/58 Attention Deficit Disorder likely Meets DSM 5 criteria for ADHD  RTC 4 weeks  Continue therapy with  Patient advised to contact office with any questions, adverse effects, or acute worsening in signs and symptoms.   Diagnoses and all orders for this visit:  Generalized anxiety disorder -     propranolol (INDERAL) 10 MG tablet; Take 1 tablet (10 mg total) by mouth 2 (two) times daily.  Attention deficit hyperactivity disorder (ADHD), unspecified ADHD type -     amphetamine-dextroamphetamine (ADDERALL) 30 MG tablet; Take 1 tablet by mouth daily. -     amphetamine-dextroamphetamine (ADDERALL) 30 MG tablet; Take 1 tablet by mouth daily.  Major depressive disorder, recurrent episode, moderate (HCC)  Other orders -      amphetamine-dextroamphetamine (ADDERALL) 30 MG tablet; Take 1 tablet by mouth daily.     Please see After Visit Summary for patient specific instructions.  Future Appointments  Date Time Provider Department Center  04/03/2020  1:00 PM Rockne Menghini, LCSW [facility] None  04/17/2020 12:00 PM Mathis Fare, LCSW [facility] None  05/01/2020 12:00 PM Mathis Fare, LCSW [facility] None  05/15/2020 12:00 PM Mathis Fare, LCSW [facility] None  05/23/2020  8:30 AM Mathis Fare, MD [facility]    No orders of the defined types were placed in this encounter.   -------------------------------

## 2020-04-03 ENCOUNTER — Other Ambulatory Visit: Payer: Self-pay

## 2020-04-03 ENCOUNTER — Ambulatory Visit (INDEPENDENT_AMBULATORY_CARE_PROVIDER_SITE_OTHER): Payer: BC Managed Care – PPO | Admitting: Psychiatry

## 2020-04-03 DIAGNOSIS — F411 Generalized anxiety disorder: Secondary | ICD-10-CM

## 2020-04-03 NOTE — Progress Notes (Signed)
[facility] Counselor/Therapist Progress Note  Patient ID: Casey Santana, MRN: [id number],    Date: 04/03/2020  Time Spent: 60 minutes   1:00pm to 2:00pm  Treatment Type: Individual Therapy  Reported Symptoms: anxiety, depression, some motivation fluctuation (but seems motivated today)   Mental Status Exam:  Appearance:   Casual     Behavior:  Appropriate, Sharing and Motivated  Motor:  Normal  Speech/Language:   Clear and Coherent  Affect:  anxious  Mood:  anxious  Thought process:  goal directed  Thought content:    WNL  Sensory/Perceptual disturbances:    WNL  Orientation:  oriented to person, place, time/date, situation, day of week, month of year and year  Attention:  Good  Concentration:  Good and Fair  Memory:  WNL  Fund of knowledge:   Good  Insight:    Good/Fair  Judgment:   Good  Impulse Control:  Good   Risk Assessment: Danger to Self:  No Self-injurious Behavior: No Danger to Others: No Duty to Warn:no Physical Aggression / Violence:No  Access to Firearms a concern: No  Gang Involvement:No   Subjective: Today reporting anxiety, depression and some fluctuation in motivation (although today is motivated). Family has just gotten through several Covid exposures but patient has stayed well.   Interventions: Solution-Oriented/Positive Psychology and Ego-Supportive  Diagnosis:   ICD-10-CM   1. Generalized anxiety disorder  F41.1      Plan of Care: Patient not signing tx plan on computer screen due to Covid.  Treatment Goals: Goal remain on tx plan as patient works with strategies to achieve her goals. Progress is noted each visit in "Progress" section of Plan.  Long term goal: Develop healthy cognitive patterns and beliefs about self and the world that help alleviate depression/anxietyand any thoughts to harm self, and also help prevent relapse of depression.  Short term goal: Identify and replace depressive thinking that leads to  depressive/anxiousfeelings and actions.   Strategy: Identify depressive/anxious/negative thoughts and self-talk that lead to depression/anxiety and work interrupt those cognitions and replace with more reality-based, positive, and empowering thoughts and self-talk.  Progress: Patient in today reporting anxiety, depression, and some motivation problems at times.  Is appearing motivated today and actually seems more verbal and invested in issues she is working on. Is doing well with no further cutting of herself as she has now remained free of cutting for 113 days.  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  She is taking a couple college level courses and is feeling some more motivated to return to school with goal to get a college degree. Is looking to make some contacts and learn more about fields she might be interested in, including music therapy, speech pathology, or maybe early childhood education.  Showing some more openness and optimism, better eye contact today. Seems to be feeling some better about herself and the possibility of eventual happiness, which she adds she has just begun to feel. Reports she did not share the letter that she has written for her parents but hopes to do so very  soon. Shared the letter in her last therapy session and processed it; it was a very well-written letter and very respectful of her parents and their feelings. Self-confidence gradually showing some gains.  Encouraged patient to see some of her progress and continue to build upon it, which is what she wants for herself.  Also  encouraged her to intentionally look for more positives than negatives, practice positive and affirming self talk, stay in the present and focus on what she can control, continue to refrain from any self cutting, remain on her medications as prescribed, intentionally believing in herself more, and stay in contact with people that are supportive of her.  Goal review and progress/challenges noted with patient.  Next appointment within 2 to 3 weeks.   Mathis Fare, LCSW

## 2020-04-15 ENCOUNTER — Ambulatory Visit (INDEPENDENT_AMBULATORY_CARE_PROVIDER_SITE_OTHER): Payer: BC Managed Care – PPO | Admitting: Psychiatry

## 2020-04-15 ENCOUNTER — Other Ambulatory Visit: Payer: Self-pay

## 2020-04-15 DIAGNOSIS — F411 Generalized anxiety disorder: Secondary | ICD-10-CM | POA: Diagnosis not present

## 2020-04-15 NOTE — Progress Notes (Signed)
[facility] Counselor/Therapist Progress Note  Patient ID: Casey Santana, MRN: [id number],    Date: 04/15/2020  Time Spent: 60 minutes   9:00am to 10:00am  Treatment Type: Individual Therapy  Reported Symptoms: anxiety, issues within family, some depression mostly re: family concerns  Mental Status Exam:  Appearance:   Casual     Behavior:  Appropriate, Sharing and Motivated  Motor:  Normal  Speech/Language:   Clear and Coherent  Affect:  anxious  Mood:  anxious  Thought process:  goal directed  Thought content:    some obsessiveness  Sensory/Perceptual disturbances:    WNL  Orientation:  oriented to person, place, time/date, situation, day of week, month of year and year  Attention:  Good  Concentration:  Good and Fair  Memory:  WNL  Fund of knowledge:   Good  Insight:    Good  Judgment:   Good  Impulse Control:  Good   Risk Assessment: Danger to Self:  No Self-injurious Behavior: No Danger to Others: No Duty to Warn:no Physical Aggression / Violence:No  Access to Firearms a concern: No  Gang Involvement:No   Subjective: Patient in today reporting anxiety and family communication issues and lack of understanding between patient and parents.  Interventions: Cognitive Behavioral Therapy, Solution-Oriented/Positive Psychology and Ego-Supportive  Diagnosis:   ICD-10-CM   1. Generalized anxiety disorder  F41.1      Plan of Care: Patient not signing tx plan on computer screen due to Covid.  Treatment Goals: Goal remain on tx plan as patient works with strategies to achieve her goals. Progress is noted each visit in "Progress" section of Plan.  Long term goal: Develop healthy cognitive patterns and beliefs about self and the world that help alleviate depression/anxietyand any thoughts to harm self, and also help prevent relapse of depression.  Short term goal: Identify and replace depressive thinking that leads to depressive/anxiousfeelings and  actions.   Strategy: Identify depressive/anxious/negative thoughts and self-talk that lead to depression/anxiety and work interrupt those cognitions and replace with more reality-based, positive, and empowering thoughts and self-talk.  Progress:   Patient in today reporting anxiety and family issues re: communication/understanding. Patient (age 20 and living at home) not feeling understood by parents in some of her choices. Some depression re: issues within family. Continues to demonstrate good motivation and follow through from therapy sessions. Feeling she can't be open with parent about her "identity". Talked through this at length today, with anxiety and some apprehension and sadness. Processed how she and parents "see things so differently" but by session end patient seemed to be more calm and grounded and "less depressed".  Does not have many people to talk to and confidence, outside of therapy sessions.  Parents have expressed concern about their differences but want to have better relationship with patient, their adult child. Patient has continued to remain free of any self cutting and is now at 124 days without any occurrences.  Is experiencing some less anxiety in certain situations, in some cases on her job and feels that letting her parents know more of her identity concerns may eventually feel like a relief.  Continues to show more openness and at times optimism.  Eye contact is increasingly better in sessions.  States that she is starting to feel what she thinks is "happiness" and some self-confidence.  Encouraged patient and her progress to practice positive and encouraging self talk, stay in the present focusing on what she can control versus what she cannot,  intentionally look for and accentuate the positives versus negatives, remain on her medications as prescribed, work on believing in herself more, refraining from any self cutting, remaining in contact with people that are supportive of  her, as she continues to build on her progress.  Goal review and progress noted with patient.  Next appt within 2-3 weeks.   Mathis Fare, LCSW

## 2020-04-17 ENCOUNTER — Ambulatory Visit: Payer: BC Managed Care – PPO | Admitting: Psychiatry

## 2020-05-01 ENCOUNTER — Other Ambulatory Visit: Payer: Self-pay

## 2020-05-01 ENCOUNTER — Ambulatory Visit (INDEPENDENT_AMBULATORY_CARE_PROVIDER_SITE_OTHER): Payer: BC Managed Care – PPO | Admitting: Psychiatry

## 2020-05-01 DIAGNOSIS — F411 Generalized anxiety disorder: Secondary | ICD-10-CM

## 2020-05-01 NOTE — Progress Notes (Signed)
[facility] Counselor/Therapist Progress Note  Patient ID: Casey Santana, MRN: [id number],    Date: 05/01/2020  Time Spent: 60 minutes   12:00noon to 1:00pm  Treatment Type: Individual Therapy  Reported Symptoms: anxiety, some avoidance of people recently, grief in death of family cat of 9 yrs.  Mental Status Exam:  Appearance:   Well Groomed     Behavior:  Appropriate, Sharing and Motivated  Motor:  Normal  Speech/Language:   Clear and Coherent  Affect:  anxious  Mood:  anxious and depressed  Thought process:  goal directed  Thought content:    some obsessiveness  Sensory/Perceptual disturbances:    WNL  Orientation:  oriented to person, place, time/date, situation, day of week, month of year and year  Attention:  Good  Concentration:  Fair  Memory:  WNL  Fund of knowledge:   Good  Insight:    Good  Judgment:   Good  Impulse Control:  Good   Risk Assessment: Danger to Self:  No Self-injurious Behavior: No Danger to Others: No Duty to Warn:no Physical Aggression / Violence:No  Access to Firearms a concern: No  Gang Involvement:No   Subjective: Patient in today with anxiety, some avoidance of people, and very sad re: death of her 20 yr old cat within past week. Grief very sharpe right now. Has not talked about it much as she doesn't share much about herself with many people.   Interventions: Cognitive Behavioral Therapy and Solution-Oriented/Positive Psychology  Diagnosis:   ICD-10-CM   1. Generalized anxiety disorder  F41.1     Plan of Care: Patient not signing tx plan on computer screen due to Covid.  Treatment Goals: Goal remain on tx plan as patient works with strategies to achieve her goals. Progress is noted each visit in "Progress" section of Plan.  Long term goal: Develop healthy cognitive patterns and beliefs about self and the world that help alleviate depression/anxietyand any thoughts to harm self, and also help prevent relapse of  depression.  Short term goal: Identify and replace depressive thinking that leads to depressive/anxiousfeelings and actions.   Strategy: Identify depressive/anxious/negative thoughts and self-talk that lead to depression/anxiety and work interrupt those cognitions and replace with more reality-based, positive, and empowering thoughts and self-talk.  Progress:   Patient in today reporting anxiety, grief/sadness over death of her cat of 9 yrs, and "stressing over work and school [employer]). Processed her sadness about her cat and how difficult it was to lose her.  Following up from last appt and patient's talking with parents recently about her "non-binary status" and spoke about how her relationship with parents is developing in a more positive direction, Very Gradually. Today spoke about how things have gone with parents more recently, and she reports their relationship has remained good. Discusses her stress over not really knowing her direction in [employer], and how to plan going forward.  Plans to speak with staff at [employer]. Self-reports today that it has been  "140 days since she last did any self-cutting". Processed thoughts/feeling about this and feels positive about that and how she is handling feelings and emotions in healthier ways.  Having some more moments of feeling positive and some increase in self-confidence.  Sometimes doubts those moments or herself as she is not used to feeling it very often and has doubted herself for quite a while.  Courage patient to keep practicing the more positive ways of thinking and behaving, use encouraging self talk, intentionally look  for positives versus negatives daily, stay in the present focusing on what she can control, continue to work on believing in herself and making good decisions, refraining from any self-cutting, remain on her medications as prescribed, and staying in contact with people that are supportive of her, as she works to move forward towards  improved emotional health.  Goal review and progress/challenges noted with patient.  Next appointment within 2 to 3 weeks.   Mathis Fare, LCSW

## 2020-05-15 ENCOUNTER — Encounter: Payer: BC Managed Care – PPO | Admitting: Psychiatry

## 2020-05-15 NOTE — Progress Notes (Signed)
This encounter was created in error - please disregard.

## 2020-05-15 NOTE — Progress Notes (Deleted)
[facility] Counselor/Therapist Progress Note  Patient ID: Casey Santana, MRN: [id number],    Date: 05/15/2020  Time Spent:    Treatment Type: Individual Therapy  Reported Symptoms:    Mental Status Exam:  Appearance:   {PSY:22683}     Behavior:  {PSY:21022743}  Motor:  {PSY:22302}  Speech/Language:   {PSY:22685}  Affect:  {PSY:22687}  Mood:  {PSY:31886}  Thought process:  {PSY:31888}  Thought content:    {PSY:[phone]}  Sensory/Perceptual disturbances:    {PSY:[phone]}  Orientation:  oriented to person, place, time/date, situation, day of week, month of year and year  Attention:  {PSY:22877}  Concentration:  {PSY:815 085 4682}  Memory:  {[phone]}  Fund of knowledge:   {PSY:815 085 4682}  Insight:    {PSY:815 085 4682}  Judgment:   {PSY:815 085 4682}  Impulse Control:  {PSY:815 085 4682}   Risk Assessment: Danger to Self:  No Self-injurious Behavior: No Danger to Others: No Duty to Warn:no Physical Aggression / Violence:No  Access to Firearms a concern: No  Gang Involvement:No   Subjective: Patient today reporting anxiety    Interventions: Solution-Oriented/Positive Psychology and Ego-Supportive  Diagnosis:   ICD-10-CM   1. Generalized anxiety disorder  F41.1       Plan of Care: Patient not signing tx plan on computer screen due to Covid.  Treatment Goals: Goal remain on tx plan as patient works with strategies to achieve her goals. Progress is noted each visit in "Progress" section of Plan.  Long term goal: Develop healthy cognitive patterns and beliefs about self and the world that help alleviate depression/anxietyand any thoughts to harm self, and also help prevent relapse of depression.  Short term goal: Identify and replace depressive thinking that leads to depressive/anxiousfeelings and actions.   Strategy: Identify depressive/anxious/negative thoughts and self-talk that lead to depression/anxiety and work interrupt those  cognitions and replace with more reality-based, positive, and empowering thoughts and self-talk.  Progress: Patient in today reporting anxiety    Patient in today reporting anxiety, grief/sadness over death of her cat of 9 yrs, and "stressing over work and school ([employer]). Processed her sadness about her cat and how difficult it was to lose her.  Following up from last appt and patient's talking with parents recently about her "non-binary status" and spoke about how her relationship with parents is developing in a more positive direction, Very Gradually. Today spoke about how things have gone with parents more recently, and she reports their relationship has remained good. Discusses her stress over not really knowing her direction in [facility], and how to plan going forward.  Plans to speak with staff at [employer]. Self-reports today that it has been  "140 days since she last did any self-cutting". Processed thoughts/feeling about this and feels positive about that and how she is handling feelings and emotions in healthier ways.  Having some more moments of feeling positive and some increase in self-confidence.  Sometimes doubts those moments or herself as she is not used to feeling it very often and has doubted herself for quite a while.  Courage patient to keep practicing the more positive ways of thinking and behaving, use encouraging self talk, intentionally look for positives versus negatives daily, stay in the present focusing on what she can control, continue to work on believing in herself and making good decisions, refraining from any self-cutting, remain on her medications as prescribed, and staying in contact with people that are supportive of her, as she works to move forward towards improved emotional health.  Goal review and progress/challenges noted with patient.  Next appointment within 2 to 3 weeks.   Mathis Fare, LCSW                   This encounter was created  in error - please disregard.

## 2020-05-23 ENCOUNTER — Encounter: Payer: Self-pay | Admitting: Family Medicine

## 2020-05-23 ENCOUNTER — Ambulatory Visit (INDEPENDENT_AMBULATORY_CARE_PROVIDER_SITE_OTHER): Payer: BC Managed Care – PPO | Admitting: Family Medicine

## 2020-05-23 ENCOUNTER — Other Ambulatory Visit: Payer: Self-pay

## 2020-05-23 VITALS — BP 110/64 | HR 87 | Temp 98.1°F | Ht 65.0 in | Wt 127.8 lb

## 2020-05-23 DIAGNOSIS — Z0000 Encounter for general adult medical examination without abnormal findings: Secondary | ICD-10-CM

## 2020-05-23 DIAGNOSIS — F988 Other specified behavioral and emotional disorders with onset usually occurring in childhood and adolescence: Secondary | ICD-10-CM | POA: Diagnosis not present

## 2020-05-23 DIAGNOSIS — F321 Major depressive disorder, single episode, moderate: Secondary | ICD-10-CM

## 2020-05-23 DIAGNOSIS — Z3009 Encounter for other general counseling and advice on contraception: Secondary | ICD-10-CM

## 2020-05-23 DIAGNOSIS — F411 Generalized anxiety disorder: Secondary | ICD-10-CM | POA: Diagnosis not present

## 2020-05-23 DIAGNOSIS — Z113 Encounter for screening for infections with a predominantly sexual mode of transmission: Secondary | ICD-10-CM

## 2020-05-23 LAB — CBC WITH DIFFERENTIAL/PLATELET
Basophils Absolute: 0.1 10*3/uL (ref 0.0–0.1)
Basophils Relative: 0.9 % (ref 0.0–3.0)
Eosinophils Absolute: 0.2 10*3/uL (ref 0.0–0.7)
Eosinophils Relative: 2.2 % (ref 0.0–5.0)
HCT: 38.8 % (ref 36.0–49.0)
Hemoglobin: 13.4 g/dL (ref 12.0–16.0)
Lymphocytes Relative: 30.2 % (ref 24.0–48.0)
Lymphs Abs: 2.2 10*3/uL (ref 0.7–4.0)
MCHC: 34.4 g/dL (ref 31.0–37.0)
MCV: 89.3 fl (ref 78.0–98.0)
Monocytes Absolute: 0.4 10*3/uL (ref 0.1–1.0)
Monocytes Relative: 5.8 % (ref 3.0–12.0)
Neutro Abs: 4.4 10*3/uL (ref 1.4–7.7)
Neutrophils Relative %: 60.9 % (ref 43.0–71.0)
Platelets: 219 10*3/uL (ref 150.0–575.0)
RBC: 4.35 Mil/uL (ref 3.80–5.70)
RDW: 13 % (ref 11.4–15.5)
WBC: 7.2 10*3/uL (ref 4.5–13.5)

## 2020-05-23 LAB — VITAMIN D 25 HYDROXY (VIT D DEFICIENCY, FRACTURES): VITD: 15.46 ng/mL — ABNORMAL LOW (ref 30.00–100.00)

## 2020-05-23 LAB — TSH: TSH: 3.12 u[IU]/mL (ref 0.40–5.00)

## 2020-05-23 LAB — HEMOGLOBIN A1C: Hgb A1c MFr Bld: 5.2 % (ref 4.6–6.5)

## 2020-05-23 NOTE — Progress Notes (Signed)
Subjective:     History was provided by the patient. Chief Complaint  Patient presents with   Annual Exam    Casey Santana is a 20 y.o. adult who is here for this well-teen visit. She is doing much better from the mood perspective; I reviewed psych notes. Now on meds for ADD and managing depression with counseling, anxiety with propranolol. Living at home, working at [employer] full time and taking some courses at [employer]. No new concerns. Has boyfriend, condom for [facility]; would like nexplanon. No sxs of STDs. Neg gc/chl/trich testing in November.   Immunization History  Administered Date(s) Administered   DTaP 02/28/2001, 05/05/2001, 07/13/2001, 05/19/2002, 01/16/2005   H1N1 04/06/2008   HPV 9-valent 11/08/2014, 01/25/2017   Hepatitis A 01/16/2005, 07/17/2005   Hepatitis B 2000/09/14, 02/28/2001, 11/03/2001   HiB (PRP-OMP) 02/28/2001, 05/05/2001, 07/13/2001, 05/19/2002   IPV 02/28/2001, 05/05/2001, 11/03/2001, 01/16/2005   Influenza,inj,Quad PF,6+ Mos 11/27/2016, 02/13/2019, 12/27/2019   Influenza-Unspecified 01/05/2007, 01/22/2011, 12/31/2011, 12/15/2015   MMR 01/04/2002, 01/16/2005   Meningococcal B, OMV 02/24/2018, 09/02/2018   Meningococcal Conjugate 11/08/2014   Meningococcal Mcv4o 02/24/2018   PFIZER(Purple Top)SARS-COV-2 Vaccination 06/30/2019, 07/24/2019, 02/07/2020   Pneumococcal Conjugate-13 02/28/2001, 05/05/2001, 07/13/2001, 12/15/2002   Tdap 09/25/2011   Varicella 01/04/2002, 01/16/2005   The following portions of the patient's history were reviewed and updated as appropriate: allergies, current medications, past family history, past medical history, past social history, past surgical history and problem list.   Review of Nutrition: Current diet: regular Balanced diet? somewhat  Social Screening:  Parental relations: good, lives with mom and dad. Some religious differences Sibling relations: older brother; he's at college  Screening  Questions: Risk factors for anemia: yes - regular menses Risk factors for vision problems: no Risk factors for hearing problems: no Risk factors for tuberculosis: no Risk factors for dyslipidemia: no Risk factors for sexually-transmitted infections: yes - screen today Risk factors for alcohol/drug use:  no    Objective:     Vitals:   05/23/20 0803  BP: 110/64  Pulse: 87  Temp: 98.1 F (36.7 C)  TempSrc: Temporal  SpO2: 98%  Weight: 127 lb 12.8 oz (58 kg)  Height: _0  (1.651 m)   Growth parameters are noted and are appropriate for age.  General:   alert, cooperative and no distress  Gait:   normal  Skin:   normal  Oral cavity:   lips, mucosa, and tongue normal; teeth and gums normal  Eyes:   sclerae white, pupils equal and reactive, red reflex normal bilaterally  Ears:   normal bilaterally  Neck:   no adenopathy, no carotid bruit, no JVD, supple, symmetrical, trachea midline and thyroid not enlarged, symmetric, no tenderness/mass/nodules  Lungs:  clear to auscultation bilaterally  Heart:   regular rate and rhythm, S1, S2 normal, no murmur, click, rub or gallop  Abdomen:  soft, non-tender; bowel sounds normal; no masses,  no organomegaly  GU:  exam deferred     Extremities:  extremities normal, atraumatic, no cyanosis or edema  Neuro:  normal without focal findings, mental status, speech normal, alert and oriented x3, PERLA and reflexes normal and symmetric    Assessment:   Encounter Diagnoses  Name Primary?   Annual physical exam Yes   Attention deficit disorder (ADD) without hyperactivity    Depression, major, single episode, moderate (HCC)    GAD (generalized anxiety disorder)    Screen for STD (sexually transmitted disease)    Birth control counseling  Plan:    1. Anticipatory guidance discussed. Gave handout on well-child issues at this age. Specific topics reviewed: drugs, ETOH, and tobacco, importance of regular dental care, importance of  regular exercise, importance of varied diet, limit TV, media violence, minimize junk food, seat belts and sex. Birth control: nexplanon.   2.  Weight management:  The patient was counseled regarding nutrition and physical activity.  3. Development: appropriate for age  24. Immunizations today: per orders. utd History of previous adverse reactions to immunizations? no  5. Mood and ADD: well controlled per psych Follow-up visit in 1 year for next well child visit, or sooner as needed.  will call to get scheduled for nexplanon placement.

## 2020-05-23 NOTE — Patient Instructions (Signed)
Please return in 12 months for your annual complete physical; please come fasting. We will call you to get you scheduled for Nexplanon insertion.   I will release your lab results to you on your MyChart account with further instructions. Please reply with any questions.   If you have any questions or concerns, please don't hesitate to send me a message via MyChart or call the office at [phone]. Thank you for visiting with [place] today! It's our pleasure caring for you.  Etonogestrel implant What is this medicine? ETONOGESTREL (et oh noe JES trel) is a contraceptive (birth control) device. It is used to prevent pregnancy. It can be used for up to 3 years. This medicine may be used for other purposes; ask your health care provider or pharmacist if you have questions. COMMON BRAND NAME(S): Implanon, Nexplanon What should I tell my health care provider before I take this medicine? They need to know if you have any of these conditions:  abnormal vaginal bleeding  blood vessel disease or blood clots  breast, cervical, endometrial, ovarian, liver, or uterine cancer  diabetes  gallbladder disease  heart disease or recent heart attack  high blood pressure  high cholesterol or triglycerides  kidney disease  liver disease  migraine headaches  seizures  stroke  tobacco smoker  an unusual or allergic reaction to etonogestrel, anesthetics or antiseptics, other medicines, foods, dyes, or preservatives  pregnant or trying to get pregnant  breast-feeding How should I use this medicine? This device is inserted just under the skin on the inner side of your upper arm by a health care professional. Talk to your pediatrician regarding the use of this medicine in children. Special care may be needed. Overdosage: If you think you have taken too much of this medicine contact a poison control center or emergency room at once. NOTE: This medicine is only for you. Do not share this medicine  with others. What if I miss a dose? This does not apply. What may interact with this medicine? Do not take this medicine with any of the following medications:  amprenavir  fosamprenavir This medicine may also interact with the following medications:  acitretin  aprepitant  armodafinil  bexarotene  bosentan  carbamazepine  certain medicines for fungal infections like fluconazole, ketoconazole, itraconazole and voriconazole  certain medicines to treat hepatitis, HIV or AIDS  cyclosporine  felbamate  griseofulvin  lamotrigine  modafinil  oxcarbazepine  phenobarbital  phenytoin  primidone  rifabutin  rifampin  rifapentine  St. John's wort  topiramate This list may not describe all possible interactions. Give your health care provider a list of all the medicines, herbs, non-prescription drugs, or dietary supplements you use. Also tell them if you smoke, drink alcohol, or use illegal drugs. Some items may interact with your medicine. What should I watch for while using this medicine? This product does not protect you against HIV infection (AIDS) or other sexually transmitted diseases. You should be able to feel the implant by pressing your fingertips over the skin where it was inserted. Contact your doctor if you cannot feel the implant, and use a non-hormonal birth control method (such as condoms) until your doctor confirms that the implant is in place. Contact your doctor if you think that the implant may have broken or become bent while in your arm. You will receive a user card from your health care provider after the implant is inserted. The card is a record of the location of the implant in your upper  arm and when it should be removed. Keep this card with your health records. What side effects may I notice from receiving this medicine? Side effects that you should report to your doctor or health care professional as soon as possible:  allergic reactions  like skin rash, itching or hives, swelling of the face, lips, or tongue  breast lumps, breast tissue changes, or discharge  breathing problems  changes in emotions or moods  coughing up blood  if you feel that the implant may have broken or bent while in your arm  high blood pressure  pain, irritation, swelling, or bruising at the insertion site  scar at site of insertion  signs of infection at the insertion site such as fever, and skin redness, pain or discharge  signs and symptoms of a blood clot such as breathing problems; changes in vision; chest pain; severe, sudden headache; pain, swelling, warmth in the leg; trouble speaking; sudden numbness or weakness of the face, arm or leg  signs and symptoms of liver injury like dark yellow or brown urine; general ill feeling or flu-like symptoms; light-colored stools; loss of appetite; nausea; right upper belly pain; unusually weak or tired; yellowing of the eyes or skin  unusual vaginal bleeding, discharge Side effects that usually do not require medical attention (report to your doctor or health care professional if they continue or are bothersome):  acne  breast pain or tenderness  headache  irregular menstrual bleeding  nausea This list may not describe all possible side effects. Call your doctor for medical advice about side effects. You may report side effects to FDA at 1-800-FDA-1088. Where should I keep my medicine? This drug is given in a hospital or clinic and will not be stored at home. NOTE: This sheet is a summary. It may not cover all possible information. If you have questions about this medicine, talk to your doctor, pharmacist, or health care provider.  2021 Elsevier/Gold Standard (2018-12-13 11:33:04)

## 2020-05-24 LAB — RPR: RPR Ser Ql: NONREACTIVE

## 2020-05-24 LAB — HEPATITIS C ANTIBODY
Hepatitis C Ab: NONREACTIVE
SIGNAL TO CUT-OFF: 0.01 (ref <1.00)

## 2020-05-24 LAB — HIV ANTIBODY (ROUTINE TESTING W REFLEX): HIV 1&2 Ab, 4th Generation: NONREACTIVE

## 2020-05-24 LAB — HEPATITIS B SURFACE ANTIGEN: Hepatitis B Surface Ag: NONREACTIVE

## 2020-05-29 ENCOUNTER — Other Ambulatory Visit: Payer: Self-pay

## 2020-05-29 ENCOUNTER — Ambulatory Visit (INDEPENDENT_AMBULATORY_CARE_PROVIDER_SITE_OTHER): Payer: BC Managed Care – PPO | Admitting: Psychiatry

## 2020-05-29 DIAGNOSIS — F411 Generalized anxiety disorder: Secondary | ICD-10-CM | POA: Diagnosis not present

## 2020-05-29 NOTE — Progress Notes (Signed)
[facility] Counselor/Therapist Progress Note  Patient ID: Casey Santana, MRN: [id number],    Date: 05/29/2020  Time Spent: 60 minutes    12:00noon to 1:00pm  Treatment Type: Individual Therapy  Reported Symptoms: anxiety, depression, sadness over loss of cat (had her 55yrs)  Mental Status Exam:  Appearance:   Casual     Behavior:  Appropriate, Sharing and Motivated  Motor:  Normal  Speech/Language:   Clear and Coherent  Affect:  anxious, depressed  Mood:  anxious, depressed and sad  Thought process:  goal directed  Thought content:    WNL  Sensory/Perceptual disturbances:    WNL  Orientation:  oriented to person, place, time/date, situation, day of week, month of year and year  Attention:  Fair  Concentration:  Fair  Memory:  WNL  Fund of knowledge:   Good  Insight:    Good  Judgment:   Good  Impulse Control:  Good   Risk Assessment: Danger to Self:  No Self-injurious Behavior: No Danger to Others: No Duty to Warn:no Physical Aggression / Violence:No  Access to Firearms a concern: No  Gang Involvement:No   Subjective: Patient reports anxiety, depression, and sadness re: death of pet cat. Some intrusive thoughts but not acting on them.  Interventions: Cognitive Behavioral Therapy and Solution-Oriented/Positive Psychology  Diagnosis:   ICD-10-CM   1. Generalized anxiety disorder  F41.1      Plan of Care: Patient not signing tx plan on computer screen due to Covid.  Treatment Goals: Goal remain on tx plan as patient works with strategies to achieve her goals. Progress is noted each visit in "Progress" section of Plan.  Long term goal: Develop healthy cognitive patterns and beliefs about self and the world that help alleviate depression/anxietyand any thoughts to harm self, and also help prevent relapse of depression.  Short term goal: Identify and replace depressive thinking that leads to depressive/anxiousfeelings and actions.    Strategy: Identify depressive/anxious/negative thoughts and self-talk that lead to depression/anxiety and work interrupt those cognitions and replace with more reality-based, positive, and empowering thoughts and self-talk.  Progress: Patient today in reporting anxiety, depression, sadness due to loss of her cat, and some intrusive thoughts but is not acting on them.  Has gone 5 1/2 months with no self-cutting and is feeling good about that. Has continued relationship with boyfriend in [place], and continuing to spend some time with friend at [facility]. Work is stressful and times but patient feels she is doing "pretty good" there.  States she was with a group of people at [employer] and a conversation began about autism and how several of them expressed feelings that patient tends to feel frequently. She "took a test found online and scored as being on the autism spectrum."  She is not sure where the test came from or "how legitimate they might be" but it did spark her interest and she is to bring in that information next session.  More concerned about changing certain behaviors than "having diagnostic labels".  Discussed this with patient today and she has some homework to follow-up on and we will pick back up next session.  I plan to also consult with her medication provider here in our office.  Following up from prior appt and discussion with parents and patient re: patient's non-binary status, and patient reports there has not been any negative repercussions, and in fact, she feels like it is improved relationships and understanding.  Discussed this in more  detail today.  (Not all details included in this note due to patient privacy).  Noted patient's increased motivation and participation in session today which was good.  Encouraged her to continue practicing the more positive ways of behaving and thinking she has reported and exhibited more recently, to practice positive self talk, stay in  the present focusing on what she can control, intentionally look for and accentuate positives daily, get outside some each day weather permitting, continue to refrain from any self-cutting, remain on her medications as prescribed, believe in herself and her ability for making good decisions, remain in contact with people that are supportive of her, and feel good about the strength that she is showing as she works on her treatment plan goals and moving forward.  Goal review and progress/challenges noted with patient.  Next appointment within 2 weeks.   Mathis Fare, LCSW

## 2020-06-06 ENCOUNTER — Encounter: Payer: Self-pay | Admitting: Adult Health

## 2020-06-06 ENCOUNTER — Other Ambulatory Visit: Payer: Self-pay | Admitting: Adult Health

## 2020-06-06 ENCOUNTER — Telehealth: Payer: Self-pay | Admitting: Adult Health

## 2020-06-06 DIAGNOSIS — F909 Attention-deficit hyperactivity disorder, unspecified type: Secondary | ICD-10-CM

## 2020-06-06 MED ORDER — AMPHETAMINE-DEXTROAMPHETAMINE 30 MG PO TABS: 30.0000 mg | ORAL_TABLET | Freq: Every day | ORAL | 0 refills | Status: DC

## 2020-06-06 NOTE — Telephone Encounter (Signed)
Please review

## 2020-06-06 NOTE — Telephone Encounter (Signed)
Script sent  

## 2020-06-06 NOTE — Telephone Encounter (Signed)
Pt would like a refill on Adderall. Please send to [facility] on [address]

## 2020-06-10 ENCOUNTER — Telehealth: Payer: Self-pay | Admitting: Adult Health

## 2020-06-10 NOTE — Telephone Encounter (Signed)
Pt requesting Rx for Adderall. Apt 4/7. Will be out of meds in 3 days. H T @ [address]

## 2020-06-11 NOTE — Telephone Encounter (Signed)
Pt already has a Rx on file for now to fill. Will contact pharmacy to confirm.

## 2020-06-12 ENCOUNTER — Ambulatory Visit: Payer: BC Managed Care – PPO | Admitting: Psychiatry

## 2020-06-13 ENCOUNTER — Telehealth: Payer: Self-pay

## 2020-06-13 NOTE — Telephone Encounter (Signed)
Tried calling the patient several times to schedule appt for the nexplanon will need to be a 60 min appt. Casey Santana said it was ok to use a same day and an open slot.

## 2020-06-19 ENCOUNTER — Encounter: Payer: Self-pay | Admitting: Family Medicine

## 2020-06-19 ENCOUNTER — Other Ambulatory Visit: Payer: Self-pay

## 2020-06-19 ENCOUNTER — Ambulatory Visit (INDEPENDENT_AMBULATORY_CARE_PROVIDER_SITE_OTHER): Payer: BC Managed Care – PPO | Admitting: Family Medicine

## 2020-06-19 VITALS — BP 120/78 | HR 98 | Temp 97.3°F | Wt 126.6 lb

## 2020-06-19 DIAGNOSIS — Z30017 Encounter for initial prescription of implantable subdermal contraceptive: Secondary | ICD-10-CM

## 2020-06-19 DIAGNOSIS — Z975 Presence of (intrauterine) contraceptive device: Secondary | ICD-10-CM | POA: Insufficient documentation

## 2020-06-19 DIAGNOSIS — Z30430 Encounter for insertion of intrauterine contraceptive device: Secondary | ICD-10-CM | POA: Diagnosis not present

## 2020-06-19 LAB — POCT URINE PREGNANCY: Preg Test, Ur: NEGATIVE

## 2020-06-19 MED ORDER — ETONOGESTREL 68 MG ~~LOC~~ IMPL
68.0000 mg | DRUG_IMPLANT | Freq: Once | SUBCUTANEOUS | Status: AC
  Administered 2020-06-19: 68 mg via SUBCUTANEOUS

## 2020-06-19 NOTE — Addendum Note (Signed)
Addended by: Laddie Aquas A on: 06/19/2020 02:44 PM   Modules accepted: Orders

## 2020-06-19 NOTE — Patient Instructions (Addendum)
Please return in 6-12 weeks for recheck if needed.   Your nexplanon is good for 3 years. It will be due out in April of 2025.  Use back up method of contraception for at least 2 weeks to ensure effective contraception.   If you have any questions or concerns, please don't hesitate to send me a message via MyChart or call the office at [phone]. Thank you for visiting with [place] today! It's our pleasure caring for you.  Nexplanon Instructions After Insertion   Keep bandage clean and dry for 24 hours   May use ice/Tylenol/Ibuprofen for soreness or pain   If you develop fever, drainage or increased warmth from incision site-contact office immediately

## 2020-06-19 NOTE — Progress Notes (Signed)
Nexplanon Insertion Procedure Note  PRE-OP DIAGNOSIS: Patient desires long-term, reversible contraception. POST-OP DIAGNOSIS: Same PROCEDURE: Nexplanon placement  PROCEDURE: -Written and verbal informed consent obtained, risks discussed included: bleeding, irregular menses, infection, pain/discomfort, cost for removal. -The appropriate universal timeout was taken and the patient's non-dominant hand was identified. -Patient was instructed to lie supine on the examination table with her non-dominant arm flexed at the elbow and externally rotated so that her hand was underneath her head (or as close as possible). -The insertion site was identified on the inner side of the non-dominant upper arm, 8 -10 cm from medial epicondyle of the humerus and 3-5 cm posterior to the sulcus (groove) between the biceps and triceps muscles. The insertion site was marked with a sterile marker. -A second spot (guiding mark) was made with the sterile marker 5 cm proximal (toward the shoulder) to the mark of the insertion site to serve as a direction guide during the Nexplanon insertion. -The insertion site was confirmed as the correct location on the inner side of the arm. -The skin was cleaned from the insertion site to the guiding mark with an antiseptic solution (Betadine / Chloraprep) and draped in a sterile fashion. -Anesthesia was achieved by injecting 2 mL of 1% lidocaine (without epinephrine) just under the skin along the planned insertion tunnel. Anesthesia confirmed. -The skin was punctured with the tip of the Nexplanon trocar needle slightly angled less than 30. The needle was inserted until the bevel (slanted opening of the tip) was just under the skin (and no further). -The applicator was then lowered to a horizontal position. While lifting the skin with the tip of the needle, the needle was inserted to its full length. Mild resistance was felt but no excessive force was used. The needle slid in  easily. -The applicator was kept in the same position with the needle inserted to its full length. The free hand was used to keep the applicator in the same position. Then the purple slider was unlocked by pushing it slightly down. The purple slider was then moved fully back until it stopped, delivering the Nexplanon capsule subcutaneously. The trocar was removed from the insertion site. -Nexplanon capsule was palpated by provider and patient to assure satisfactory placement. -A Bandage and a pressure pressing were applied to the area. Patient to keep the pressure dressing for 24 hrs and the bandage for 3-5 days. -Anticipatory guidance, as well as standard post-procedure care, was explained. -Return precautions are given. The patient tolerated the procedure well without complications. -Estimated blood loss was less than 0.5 mL Follow-up: 8-12 weeks, at which time the placement will be checked  Urine preg was negative today in the office.

## 2020-06-20 ENCOUNTER — Ambulatory Visit: Payer: BC Managed Care – PPO | Admitting: Adult Health

## 2020-06-26 ENCOUNTER — Ambulatory Visit (INDEPENDENT_AMBULATORY_CARE_PROVIDER_SITE_OTHER): Payer: BC Managed Care – PPO | Admitting: Psychiatry

## 2020-06-26 ENCOUNTER — Other Ambulatory Visit: Payer: Self-pay

## 2020-06-26 DIAGNOSIS — F411 Generalized anxiety disorder: Secondary | ICD-10-CM | POA: Diagnosis not present

## 2020-06-26 NOTE — Progress Notes (Signed)
[facility] Counselor/Therapist Progress Note  Patient ID: Casey Santana, MRN: [id number],    Date: 06/26/2020  Time Spent: 60 minutes    1:00pm to 2:00pm  Treatment Type: Individual Therapy  Reported Symptoms: Anxiety, depression, lowered motivation   Mental Status Exam:  Appearance:   Neat     Behavior:  Appropriate and Sharing  Motor:  Normal  Speech/Language:   Clear and Coherent  Affect:  anxiety, some depression  Mood:  anxious and depressed  Thought process:  goal directed  Thought content:    some obsessiveness  Sensory/Perceptual disturbances:    WNL  Orientation:  oriented to person, place, time/date, situation, day of week, month of year and year  Attention:  Good  Concentration:  Good and Fair  Memory:  WNL  Fund of knowledge:   Good  Insight:    Good and Fair  Judgment:   Good and Fair  Impulse Control:  Good   Risk Assessment: Danger to Self:  No Self-injurious Behavior: No Danger to Others: No Duty to Warn:no Physical Aggression / Violence:No  Access to Firearms a concern: No  Gang Involvement:No   Subjective: Patient today reports anxiety and some depression. Frustrated with  School and has difficulty turning assignments in on time. (See Progress Note below.)   Interventions: Cognitive Behavioral Therapy and Solution-Oriented/Positive Psychology  Diagnosis:   ICD-10-CM   1. Generalized anxiety disorder  F41.1      Plan of Care: Patient not signing tx plan on computer screen due to Covid.  Treatment Goals: Goal remain on tx plan as patient works with strategies to achieve her goals. Progress is noted each visit in "Progress" section of Plan.  Long term goal: Develop healthy cognitive patterns and beliefs about self and the world that help alleviate depression/anxietyand any thoughts to harm self, and also help prevent relapse of depression.  Short term goal: Identify and replace depressive thinking that leads to  depressive/anxiousfeelings and actions.   Strategy: Identify depressive/anxious/negative thoughts and self-talk that lead to depression/anxiety and work interrupt those cognitions and replace with more reality-based, positive, and empowering thoughts and self-talk.  Progress: Patient in today reporting anxiety, and some depression but anxiety is the stronger symptom. Followed up on some information from prior session re: some "asberger's type traits", "mosty the social awkwardness".  Patient has concerns about this and it is also very sensitive subject for her.  She wanted to talk through this more in depth in session today which we did.  Patient was able to share in more detail the difficulty she has in meeting people, making friends, and keeping friends due to some of her social awkwardness.  Also discussed healthy boundaries which she found helpful.  Together we looked at how she can work to become more comfortable in situations where she is meeting new people and also situations that arise in being in relationship with coworkers.  Anxious thoughts will often get in her way and make the situations worse.  Reviewed interruption and replacement of anxious thoughts as discussed in prior session and patient is to continue working on this between sessions.  Patient also reports that parents seem to be sensitive to her non-binary status and feels they are genuinely trying to communicate with her better, and feels that their relationship continues to be improved with more understanding between each of them.  Encourage patient to put into practice some of the things we spoke about today as far as in relationship to other  people and trying to have more of a comfort level and meeting people, but also some patience and knowing that forming relationships takes time.  Also encouraged her to spend some time outside each day, to continue setting healthy boundaries with other people as needed including coworkers, to  practice positive self talk, to remain in the present focusing on what she can control or change versus cannot, intentionally look for positives daily, continue to refrain from any self cutting, to believe in herself and her ability for making good decisions and for making progress in relationships with others, to remain on her medications as prescribed, to maintain contact with people that are supportive of her, and to realize the strength she is showing and confronting issues that are challenging for her as she tries to move forward in a more positive direction.   Review and progress/challenges noted with patient.  Next appointment within 2 to 3 weeks.   Mathis Fare, LCSW

## 2020-07-10 ENCOUNTER — Other Ambulatory Visit: Payer: Self-pay

## 2020-07-10 ENCOUNTER — Ambulatory Visit (INDEPENDENT_AMBULATORY_CARE_PROVIDER_SITE_OTHER): Payer: BC Managed Care – PPO | Admitting: Psychiatry

## 2020-07-10 DIAGNOSIS — F411 Generalized anxiety disorder: Secondary | ICD-10-CM | POA: Diagnosis not present

## 2020-07-10 NOTE — Progress Notes (Signed)
[facility] Counselor/Therapist Progress Note  Patient ID: Casey Santana, MRN: [id number],    Date: 07/10/2020  Time Spent: 60 minutes    1:00pm to 2:00pm  Treatment Type: Individual Therapy  Reported Symptoms: anxiety, depression, lack of motivation for schoolwork   Mental Status Exam:  Appearance:   Casual     Behavior:  Appropriate and Sharing  Motor:  Normal  Speech/Language:   Clear and Coherent  Affect:  anxious, depressed  Mood:  anxious and depressed  Thought process:  goal directed  Thought content:    WNL  Sensory/Perceptual disturbances:    WNL  Orientation:  oriented to person, place, time/date, situation, day of week, month of year and year  Attention:  Fair  Concentration:  Good and Fair  Memory:  WNL  Fund of knowledge:   Good  Insight:    Good and Fair  Judgment:   Good and Fair  Impulse Control:  Good   Risk Assessment: Danger to Self:  No Self-injurious Behavior: No Danger to Others: No Duty to Warn:no Physical Aggression / Violence:No  Access to Firearms a concern: No  Gang Involvement:No   Subjective: Patient today reporting anxiety, some depression, and difficulty motivating for schoolwork.  See Progress Note below.   Interventions: Solution-Oriented/Positive Psychology and Ego-Supportive  Diagnosis:   ICD-10-CM   1. Generalized anxiety disorder  F41.1       Plan of Care: Patient not signing tx plan on computer screen due to Covid.  Treatment Goals: Goal remain on tx plan as patient works with strategies to achieve her goals. Progress is noted each visit in "Progress" section of Plan.  Long term goal: Develop healthy cognitive patterns and beliefs about self and the world that help alleviate depression/anxietyand any thoughts to harm self, and also help prevent relapse of depression.  Short term goal: Identify and replace depressive thinking that leads to depressive/anxiousfeelings and actions.    Strategy: Identify depressive/anxious/negative thoughts and self-talk that lead to depression/anxiety and work interrupt those cognitions and replace with more reality-based, positive, and empowering thoughts and self-talk.  Progress: Patient in today reporting anxiety, depression, and lack of motivation mostly for schoolwork and some household chores. Discussed her lower self-confidence and pressure she feels especially about school and trying to figure out what she wants to do re: career. Tearful and expressing some self-doubt and feeling badly that "I'm not as on top of my school plan/direction as I should be but don't really know what I want to do. " Does have appt soon with advisor at school re: future educational planning for patient.  Worked in session today on multiple concerns including lack of motivation, lower self-confidence, and feeling pressured about knowing what she wants to do in her future and being able to articulate it now.  Patient was less tearful the more we spoke about these concerns and was able to see how they are affecting each other, particularly lower self-confidence and feeling pressured.  Looked at some of the negative ways in which she sees herself and how she feels inferior and "less than".  Discussed this more at length and how she can feel differently.  Patient also able to see how she sometimes feels rejected by people but by her own behavior she sometimes pushes people away.  Gave examples of herself doing this with parents especially, but others as well.  Discussed ways, using some of her specific examples, that she can interact with others in a more open  way and not push them away.  Patient able to see how the concerns she reported and that we addressed today are interlinked and had some hope that in changing a couple things, it might have the effect of being her change even more things that are contributing to her lower self-confidence and to her lack of  motivation.  She is to continue working on this between sessions and we will pick up next time as we ran out of time today.  Also encouraged patient to be more open to interacting with other people, to go prepared to her meeting with the advisor at school including taking some written notes on her thoughts about her future education, to practice more positive self talk daily, to interrupt her anxious/negative thoughts and replace with more reality-based thoughts that do not support her anxiety, maintain healthy boundaries with other people as needed, work towards having an increased comfort level and meeting new people, get outside some each day and walk, stay in the present focusing on what she can control versus cannot, intentionally look for positives daily, continue to refrain from any self cutting, practice believing in herself more that she can make good decisions and make progress on her goals, to remain in contact with people that are supportive of her, and to be patient with herself realizing the strength that she is showing and trying to work through challenging issues and be able to move forward in a better direction.   Goal review and progress/challenges noted with patient.  Next appointment within 2 weeks.   Mathis Fare, LCSW

## 2020-07-11 ENCOUNTER — Emergency Department (HOSPITAL_BASED_OUTPATIENT_CLINIC_OR_DEPARTMENT_OTHER)
Admission: EM | Admit: 2020-07-11 | Discharge: 2020-07-11 | Disposition: A | Discharge status: Home / Self Care | Payer: BC Managed Care – PPO | Attending: Emergency Medicine | Admitting: Emergency Medicine

## 2020-07-11 ENCOUNTER — Emergency Department (HOSPITAL_BASED_OUTPATIENT_CLINIC_OR_DEPARTMENT_OTHER): Payer: BC Managed Care – PPO | Admitting: Radiology

## 2020-07-11 ENCOUNTER — Other Ambulatory Visit: Payer: Self-pay

## 2020-07-11 ENCOUNTER — Encounter (HOSPITAL_BASED_OUTPATIENT_CLINIC_OR_DEPARTMENT_OTHER): Payer: Self-pay | Admitting: Emergency Medicine

## 2020-07-11 DIAGNOSIS — Z91010 Allergy to peanuts: Secondary | ICD-10-CM | POA: Diagnosis not present

## 2020-07-11 DIAGNOSIS — R0789 Other chest pain: Secondary | ICD-10-CM

## 2020-07-11 DIAGNOSIS — R079 Chest pain, unspecified: Secondary | ICD-10-CM | POA: Diagnosis present

## 2020-07-11 DIAGNOSIS — M79606 Pain in leg, unspecified: Secondary | ICD-10-CM | POA: Insufficient documentation

## 2020-07-11 LAB — BASIC METABOLIC PANEL
Anion gap: 8 (ref 5–15)
BUN: 8 mg/dL (ref 6–20)
CO2: 25 mmol/L (ref 22–32)
Calcium: 9.9 mg/dL (ref 8.9–10.3)
Chloride: 106 mmol/L (ref 98–111)
Creatinine, Ser: 0.68 mg/dL (ref 0.44–1.00)
GFR, Estimated: >60 mL/min (ref >60)
Glucose, Bld: 100 mg/dL — ABNORMAL HIGH (ref 70–99)
Potassium: 3.4 mmol/L — ABNORMAL LOW (ref 3.5–5.1)
Sodium: 139 mmol/L (ref 135–145)

## 2020-07-11 LAB — URINALYSIS, ROUTINE W REFLEX MICROSCOPIC
Bilirubin Urine: NEGATIVE
Glucose, UA: NEGATIVE mg/dL
Hgb urine dipstick: NEGATIVE
Ketones, ur: 15 mg/dL — AB
Leukocytes,Ua: NEGATIVE
Nitrite: NEGATIVE
Protein, ur: NEGATIVE mg/dL
Specific Gravity, Urine: 1.009 (ref 1.005–1.030)
pH: 6 (ref 5.0–8.0)

## 2020-07-11 LAB — CBC
HCT: 37.8 % (ref 36.0–46.0)
Hemoglobin: 13 g/dL (ref 12.0–15.0)
MCH: 30.7 pg (ref 26.0–34.0)
MCHC: 34.4 g/dL (ref 30.0–36.0)
MCV: 89.2 fL (ref 80.0–100.0)
Platelets: 207 10*3/uL (ref 150–400)
RBC: 4.24 MIL/uL (ref 3.87–5.11)
RDW: 12 % (ref 11.5–15.5)
WBC: 5.4 10*3/uL (ref 4.0–10.5)
nRBC: 0 % (ref 0.0–0.2)

## 2020-07-11 LAB — D-DIMER, QUANTITATIVE: D-Dimer, Quant: <0.27 ug/mL-FEU (ref 0.00–0.50)

## 2020-07-11 LAB — TROPONIN I (HIGH SENSITIVITY): Troponin I (High Sensitivity): <2 ng/L (ref <18)

## 2020-07-11 LAB — PREGNANCY, URINE: Preg Test, Ur: NEGATIVE

## 2020-07-11 NOTE — ED Provider Notes (Signed)
[facility] EMERGENCY DEPT Provider Note   CSN: [id number] Arrival date & time: 07/11/20  1720     History Chief Complaint  Patient presents with  . Chest Pain  . Leg Pain    Casey Santana is a 20 y.o. adult.  Pt presents to the ED with cp and leg pain.  Pt had the Nexplanon implanted about 4 weeks ago.  She said she has been having some leg aching and some left sided cp for the past few days.  Pt was concerned that she had a blood clot, so she came in.  She denies any current sob or cp.        Past Medical History:  Diagnosis Date  . Anxiety   . Depression   . Dysmenorrhea   . Heart murmur     Patient Active Problem List   Diagnosis Date Noted  . Presence of subdermal contraceptive implant Nexplanon 06/2020 06/19/2020  . Attention deficit disorder (ADD) without hyperactivity 05/23/2020  . GAD (generalized anxiety disorder) 05/23/2020  . Peanut allergy 03/03/2019  . Depression, major, single episode, moderate (HCC) 03/01/2019    Past Surgical History:  Procedure Laterality Date  . NO PAST SURGERIES       OB History   No obstetric history on file.     Family History  Problem Relation Age of Onset  . Arthritis Maternal Grandmother   . Hearing loss Maternal Grandmother   . Hyperlipidemia Maternal Grandmother   . Hypertension Maternal Grandmother   . Anxiety disorder Maternal Grandmother   . Arthritis Maternal Grandfather   . Cancer Maternal Grandfather   . Depression Maternal Grandfather   . Hearing loss Maternal Grandfather   . Hyperlipidemia Maternal Grandfather   . Hyperlipidemia Paternal Grandmother   . Hyperlipidemia Paternal Grandfather   . Mental retardation Mother        bipolar disorder  . Arthritis Mother   . Hyperlipidemia Father   . Bipolar disorder Other   . Schizophrenia Other   . Migraines Neg Hx   . Seizures Neg Hx   . Autism Neg Hx   . ADD / ADHD Neg Hx     Social History   Tobacco Use  . Smoking status:  Never Smoker  . Smokeless tobacco: Never Used  Vaping Use  . Vaping Use: Never used  Substance Use Topics  . Alcohol use: No  . Drug use: No    Home Medications Prior to Admission medications   Medication Sig Start Date End Date Taking? Authorizing Provider  amphetamine-dextroamphetamine (ADDERALL) 30 MG tablet Take 1 tablet by mouth daily. 03/22/20  Yes Mozingo, Thereasa Solo, NP  EPINEPHrine (EPIPEN 2-PAK) 0.3 mg/0.3 mL IJ SOAJ injection Inject 0.3 mLs (0.3 mg total) into the muscle as needed for anaphylaxis. 03/03/19  Yes Willow Ora, MD  propranolol (INDERAL) 10 MG tablet Take 1 tablet (10 mg total) by mouth 2 (two) times daily. 03/22/20  Yes Mozingo, Thereasa Solo, NP  amphetamine-dextroamphetamine (ADDERALL) 30 MG tablet Take 1 tablet by mouth daily. Patient not taking: Reported on 06/19/2020 04/19/20   Mozingo, Thereasa Solo, NP  amphetamine-dextroamphetamine (ADDERALL) 30 MG tablet Take 1 tablet by mouth daily. Patient not taking: Reported on 06/19/2020 06/06/20   Mozingo, Thereasa Solo, NP    Allergies    Peanut-containing drug products  Review of Systems   Review of Systems  Cardiovascular: Positive for chest pain.  Musculoskeletal:       Leg pain  All other systems reviewed and  are negative.   Physical Exam Updated Vital Signs BP 119/84   Pulse 77   Temp 98.6 F (37 C) (Oral)   Resp (!) 21   Ht 5\' 5"  (1.651 m)   Wt 56.7 kg   LMP 07/01/2020   SpO2 99%   BMI 20.80 kg/m   Physical Exam Vitals and nursing note reviewed.  Constitutional:      Appearance: Casey E Auvil "MICAH" is well-developed.  HENT:     Head: Normocephalic and atraumatic.  Eyes:     Extraocular Movements: Extraocular movements intact.     Pupils: Pupils are equal, round, and reactive to light.  Cardiovascular:     Rate and Rhythm: Normal rate and regular rhythm.     Heart sounds: Normal heart sounds.  Pulmonary:     Effort: Pulmonary effort is normal.     Breath sounds: Normal  breath sounds.  Abdominal:     General: Bowel sounds are normal.     Palpations: Abdomen is soft.  Musculoskeletal:        General: Normal range of motion.     Cervical back: Normal range of motion and neck supple.  Skin:    General: Skin is warm.     Capillary Refill: Capillary refill takes less than 2 seconds.  Neurological:     General: No focal deficit present.     Mental Status: Casey E Berringer "MICAH" is alert and oriented to person, place, and time.  Psychiatric:        Mood and Affect: Mood normal.        Behavior: Behavior normal.     ED Results / Procedures / Treatments   Labs (all labs ordered are listed, but only abnormal results are displayed) Labs Reviewed  BASIC METABOLIC PANEL - Abnormal; Notable for the following components:      Result Value   Potassium 3.4 (*)    Glucose, Bld 100 (*)    All other components within normal limits  URINALYSIS, ROUTINE W REFLEX MICROSCOPIC - Abnormal; Notable for the following components:   Ketones, ur 15 (*)    All other components within normal limits  CBC  PREGNANCY, URINE  D-DIMER, QUANTITATIVE  TROPONIN I (HIGH SENSITIVITY)  TROPONIN I (HIGH SENSITIVITY)    EKG EKG Interpretation  Date/Time:  Thursday July 11 2020 17:57:36 EDT Ventricular Rate:  80 PR Interval:  128 QRS Duration: 84 QT Interval:  343 QTC Calculation: 396 R Axis:   75 Text Interpretation: Sinus rhythm No old tracing to compare Confirmed by [birth year]) on 07/11/2020 6:03:42 PM   Radiology DG Chest 2 View  Result Date: 07/11/2020 CLINICAL DATA:  Chest pain EXAM: CHEST - 2 VIEW COMPARISON:  None. FINDINGS: The heart size and mediastinal contours are within normal limits. Both lungs are clear. The visualized skeletal structures are unremarkable. IMPRESSION: No active cardiopulmonary disease. Electronically Signed   By: 07/13/2020 M.D.   On: 07/11/2020 18:16    Procedures Procedures   Medications Ordered in ED Medications - No  data to display  ED Course  I have reviewed the triage vital signs and the nursing notes.  Pertinent labs & imaging results that were available during my care of the patient were reviewed by me and considered in my medical decision making (see chart for details).    MDM Rules/Calculators/A&P  I have a low suspicion for DVT or PE.  Pt's legs do not look swollen.  She is not tachycardic and she is not sob.  O2 sat nl.  Ddimer neg.  Cardiac eval nl.  Pt is stable for d/c.  Return if worse.  Final Clinical Impression(s) / ED Diagnoses Final diagnoses:  Atypical chest pain    Rx / DC Orders ED Discharge Orders    None       Jacalyn Lefevre, MD 07/11/20 [facility]ell Moment

## 2020-07-11 NOTE — ED Triage Notes (Signed)
Pt via pov from home with chest pain and leg pain that started Monday. Pt recently had birth control implant and is concerned that she might be getting blood clots. Pt states she could not breathe in completely on Monday, but that has resolved. Pt endorses some nausea, but states she has anxiety and nausea goes with the anxiety. Pt alert & oriented, nad noted.

## 2020-07-12 ENCOUNTER — Telehealth: Payer: Self-pay

## 2020-07-12 NOTE — Telephone Encounter (Signed)
Patient was seen in ED  Nurse Assessment Nurse: Ladona Ridgel, RN, Casey Santana Date/Time Casey Santana Time): 07/11/2020 4:33:31 PM Confirm and document reason for call. If symptomatic, describe symptoms. ---Pt had nexplanon placed 4 weeks ago and is having chest pain onset Monday (she had driven to [facility] and back the day before and there was pollen in the air - 3 h). She was taking care of finals as well. Does the patient have any new or worsening symptoms? ---Yes Will a triage be completed? ---Yes Related visit to physician within the last 2 weeks? ---No Does the PT have any chronic conditions? (i.e. diabetes, asthma, this includes High risk factors for pregnancy, etc.) ---No Is the patient pregnant or possibly pregnant? (Ask all females between the ages of 33-55) ---No Is this a behavioral health or substance abuse call? ---No Guidelines Guideline Title Affirmed Question Affirmed Notes Nurse Date/Time (Eastern Time) Chest Pain [1] Chest pain (or "angina") comes and goes AND [2] is happening more often (increasing in [place], RN, [facility] 07/11/2020 4:34:56 PM PLEASE NOTE: All timestamps contained within this report are represented as [place] Standard Time. CONFIDENTIALTY NOTICE: This fax transmission is intended only for the addressee. It contains information that is legally privileged, confidential or otherwise protected from use or disclosure. If you are not the intended recipient, you are strictly prohibited from reviewing, disclosing, copying using or disseminating any of this information or taking any action in reliance on or regarding this information. If you have received this fax in error, please notify us immediately by telephone so that we can arrange for its return to [place]. Phone: [phone], Toll-Free: [phone], Fax: [phone] Page: 2 of 2 Call Id: [id number] Guidelines Guideline Title Affirmed Question Affirmed Notes Nurse Date/Time Casey Santana Time) frequency) or  getting worse (increasing in severity) (Exception: chest pains that last only a few seconds) Disp. Time Casey Santana Time) Disposition Final User 07/11/2020 4:32:04 PM Send to Urgent Queue [place], [name] 07/11/2020 4:38:58 PM Go to ED Now Yes Ladona Ridgel, RN, [name] Disagree/Comply Comply Caller Understands Yes PreDisposition Did not know what to do Care Advice Given Per Guideline GO TO ED NOW: * You need to be seen in the Emergency Department. * It is better and safer if another adult drives instead of you. * Bring a list of your current medicines when you go to the Emergency Department (ER). * Do not eat or drink anything for now. * You become worse * Severe difficulty breathing occurs * Passes out or becomes too weak to stand Referrals GO TO FACILITY UNDECIDED GO TO FACILITY OTHER - SPECIF

## 2020-07-12 NOTE — Telephone Encounter (Signed)
FYI

## 2020-07-17 ENCOUNTER — Ambulatory Visit (INDEPENDENT_AMBULATORY_CARE_PROVIDER_SITE_OTHER): Payer: BC Managed Care – PPO | Admitting: Adult Health

## 2020-07-17 ENCOUNTER — Encounter: Payer: Self-pay | Admitting: Adult Health

## 2020-07-17 ENCOUNTER — Other Ambulatory Visit: Payer: Self-pay

## 2020-07-17 DIAGNOSIS — F331 Major depressive disorder, recurrent, moderate: Secondary | ICD-10-CM

## 2020-07-17 DIAGNOSIS — F909 Attention-deficit hyperactivity disorder, unspecified type: Secondary | ICD-10-CM | POA: Diagnosis not present

## 2020-07-17 DIAGNOSIS — F411 Generalized anxiety disorder: Secondary | ICD-10-CM | POA: Diagnosis not present

## 2020-07-17 NOTE — Progress Notes (Signed)
Casey Santana [id number] 13-Feb-2001 20 y.o.  Subjective:   Patient ID:  Casey Santana Dominic is a 20 y.o. (DOB 31-Aug-2000) adult.  Chief Complaint: No chief complaint on file.   HPI Casey Santana presents to the office today for follow-up of  MDD, ADHD, and GAD.  Describes mood today as "ok". Mood symptoms - reports decreased and irritability. More anxious lately - "health related issues". Some stressors with trying to decide what her "path" will be. Dating x 6 months. Family doing well. Stating "I'm doing ok". Doing well in work setting. Feels like medications are working well. Seeing therapist Rockne Menghini. Improved interest and motivation. Taking medications as prescribed.  Energy levels stable. Active, does not have a regular exercise routine.  Enjoys some usual interests and activities. Single. Has a boyfriend of 6 months. Lives at home with parents and 2 dogs - "Candy Sledge and Perlie Gold" - Cat passed away in 05-20-2022. Has a 29 year old brother. Spending time with family.  Appetite adequate. Weight stable - 126 pounds. Sleeps well most nights. Averages 7 to 8 hours. Focus and concentration stable. Completing tasks. Managing aspects of household. Work going well. Works 25 to 30 hours a week at [employer]. Taking 3 classes at [employer]. Denies self harm since September. Denies SI or HI.  Denies AH or VH. Denies substance use, over spending, or reckless behaviors.   Previous medication trials: Lexapro, Prozac, Propanolol, Cymbalta     GAD-7   Flowsheet Row Office Visit from 02/13/2019 in [facility] from 02/24/2018 in [facility]  Total GAD-7 Score 14 9    PHQ2-9   Flowsheet Row Office Visit from 06/19/2020 in [facility] from 05/12/2019 in [facility] from 04/27/2019 in [facility] from 02/13/2019 in [facility] from 02/24/2018 in [facility]  PHQ-2 Total Score 0 4 4 2 3   PHQ-9 Total Score -- 19 20 19 11     Flowsheet Row ED from 07/11/2020 in [facility] Office Visit from 02/13/2019 in [facility]  C-SSRS RISK CATEGORY No Risk Error: Question 6 not populated       Review of Systems:  Review of Systems  Musculoskeletal: Negative for gait problem.  Neurological: Negative for tremors.  Psychiatric/Behavioral:       Please refer to HPI    Medications: I have reviewed the patient's current medications.  Current Outpatient Medications  Medication Sig Dispense Refill  . amphetamine-dextroamphetamine (ADDERALL) 30 MG tablet Take 1 tablet by mouth daily. 30 tablet 0  . amphetamine-dextroamphetamine (ADDERALL) 30 MG tablet Take 1 tablet by mouth daily. (Patient not taking: Reported on 06/19/2020) 30 tablet 0  . amphetamine-dextroamphetamine (ADDERALL) 30 MG tablet Take 1 tablet by mouth daily. (Patient not taking: Reported on 06/19/2020) 30 tablet 0  . EPINEPHrine (EPIPEN 2-PAK) 0.3 mg/0.3 mL IJ SOAJ injection Inject 0.3 mLs (0.3 mg total) into the muscle as needed for anaphylaxis. 1 each 2  . propranolol (INDERAL) 10 MG tablet Take 1 tablet (10 mg total) by mouth 2 (two) times daily. 60 tablet 5   No current facility-administered medications for this visit.    Medication Side Effects: None  Allergies:  Allergies  Allergen Reactions  . Peanut-Containing Drug Products     Past Medical History:  Diagnosis Date  . Anxiety   . Depression   . Dysmenorrhea   .  Heart murmur     Past Medical History, Surgical history, Social history, and Family history were reviewed and updated as appropriate.   Please see review of systems for further details on the patient's review from today.   Objective:   Physical Exam:  LMP 07/01/2020   Physical Exam Constitutional:       General: Markiya E Santana "MICAH" is not in acute distress. Musculoskeletal:        General: No deformity.  Neurological:     Mental Status: [name] "MICAH" is alert and oriented to person, place, and time.     Coordination: Coordination normal.  Psychiatric:        Attention and Perception: Attention and perception normal. Casey Santana "MICAH" does not perceive auditory or visual hallucinations.        Mood and Affect: Mood normal. Mood is not anxious or depressed. Affect is not labile, blunt, angry or inappropriate.        Speech: Speech normal.        Behavior: Behavior normal.        Thought Content: Thought content normal. Thought content is not paranoid or delusional. Thought content does not include homicidal or suicidal ideation. Thought content does not include homicidal or suicidal plan.        Cognition and Memory: Cognition and memory normal.        Judgment: Judgment normal.     Comments: Insight intact     Lab Review:     Component Value Date/Time   NA 139 07/11/2020 1738   K 3.4 (L) 07/11/2020 1738   CL 106 07/11/2020 1738   CO2 25 07/11/2020 1738   GLUCOSE 100 (H) 07/11/2020 1738   BUN 8 07/11/2020 1738   CREATININE 0.68 07/11/2020 1738   CALCIUM 9.9 07/11/2020 1738   GFRNONAA >60 07/11/2020 1738       Component Value Date/Time   WBC 5.4 07/11/2020 1738   RBC 4.24 07/11/2020 1738   HGB 13.0 07/11/2020 1738   HCT 37.8 07/11/2020 1738   PLT 207 07/11/2020 1738   MCV 89.2 07/11/2020 1738   MCH 30.7 07/11/2020 1738   MCHC 34.4 07/11/2020 1738   RDW 12.0 07/11/2020 1738   LYMPHSABS 2.2 05/23/2020 0828   MONOABS 0.4 05/23/2020 0828   EOSABS 0.2 05/23/2020 0828   BASOSABS 0.1 05/23/2020 0828    No results found for: POCLITH, LITHIUM   No results found for: PHENYTOIN, PHENOBARB, VALPROATE, CBMZ   .res Assessment: Plan:    Plan:  PDMP reviewed  1. Adderall 30mg  daily. 2. Propanolol 10mg  BID for anxiety  40/58 Attention Deficit Disorder  likely  Meets DSM 5 criteria for ADHD  RTC 4 weeks  Continue therapy with  Patient advised to contact office with any questions, adverse effects, or acute worsening in signs and symptoms.   Diagnoses and all orders for this visit:  Major depressive disorder, recurrent episode, moderate (HCC)  Attention deficit hyperactivity disorder (ADHD), unspecified ADHD type  Generalized anxiety disorder     Please see After Visit Summary for patient specific instructions.  Future Appointments  Date Time Provider Department Center  07/24/2020  1:00 PM Rockne Menghini, LCSW [facility] None  08/07/2020  1:00 PM Mathis Fare, LCSW [facility] None  08/21/2020  1:00 PM Mathis Fare, LCSW [facility] None  05/26/2021  2:00 PM Mathis Fare, MD [facility]    No orders of the defined types were placed in this encounter.   -------------------------------

## 2020-07-18 ENCOUNTER — Encounter (INDEPENDENT_AMBULATORY_CARE_PROVIDER_SITE_OTHER): Payer: Self-pay

## 2020-07-24 ENCOUNTER — Other Ambulatory Visit: Payer: Self-pay

## 2020-07-24 ENCOUNTER — Ambulatory Visit (INDEPENDENT_AMBULATORY_CARE_PROVIDER_SITE_OTHER): Payer: BC Managed Care – PPO | Admitting: Psychiatry

## 2020-07-24 DIAGNOSIS — F411 Generalized anxiety disorder: Secondary | ICD-10-CM

## 2020-07-24 NOTE — Progress Notes (Signed)
[facility] Counselor/Therapist Progress Note  Patient ID: Casey Santana, MRN: [id number],    Date: 07/24/2020  Time Spent:  60 minutes     1:00pm to 2:00pm   Treatment Type: Individual Therapy  Reported Symptoms: anxiety, depressed, tired  Mental Status Exam:  Appearance:   Casual     Behavior:  Appropriate, Sharing and Motivated  Motor:  Normal  Speech/Language:   Clear and Coherent  Affect:  anxious, depressed  Mood:  anxious and depressed  Thought process:  normal  Thought content:    WNL  Sensory/Perceptual disturbances:    WNL  Orientation:  oriented to person, place, time/date, situation, day of week, month of year and year  Attention:  Fair  Concentration:  Fair  Memory:  WNL  Fund of knowledge:   Good  Insight:    Good and Fair  Judgment:   Good  Impulse Control:  Good   Risk Assessment: Danger to Self:  No Self-injurious Behavior: No Danger to Others: No Duty to Warn:no Physical Aggression / Violence:No  Access to Firearms a concern: No  Gang Involvement:No   Subjective: Patient today reporting anxiety and depression, and tired and 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 Seeming more comfortable with herself.  See Progress Note below.  Interventions: Solution-Oriented/Positive Psychology and Ego-Supportive  Diagnosis:   ICD-10-CM   1. Generalized anxiety disorder  F41.1      Plan of Care: Patient not signing tx plan on computer screen due to Covid.  Treatment Goals: Goal remain on tx plan as patient works with strategies to achieve her goals. Progress is noted each visit in "Progress" section of Plan.  Long term goal: Develop healthy cognitive patterns and beliefs about self and the world that help alleviate depression/anxietyand any thoughts to harm self, and also help prevent relapse of depression.  Short term goal: Identify and replace depressive thinking that leads to depressive/anxiousfeelings and actions.    Strategy: Identify depressive/anxious/negative thoughts and self-talk that lead to depression/anxiety and work interrupt those cognitions and replace with more reality-based, positive, and empowering thoughts and self-talk.  Progress: Patient in today reporting anxiety, depression, body image issues, some self-doubt, and reports having free of any self-cutting. (It has now been 7-1/2 months since any cutting of herself).  Picked up from last session when we worked on issues of lack of motivation, lower self-confidence, and feeling pressured to make decisions about school and her future.  Patient did follow through and some work on this between sessions as recommended previously.  Her motivation definitely seem better today although she still is not feeling like she has a specific direction in which she wants to go as far as schooling, although she is having some interest in speech pathology.  She talked about this more at length today and is trying to be more diligent in her current school work which involves taking classes and things where she is really not interested.  Helped her realize that her current courses are basically general education courses where these are needed in order to get 2 courses that would be more in whenever she decides her major to be and would hopefully be more of interest to her.  Patient is trying more positive self talk and coaching herself through this time right now and her grades have definitely been very acceptable so far.  There was no tearfulness today and patient did seem to some more self-confident, and affect was a little brighter.  Talked some about the ways she still sees herself "differently", sometimes feeling rejected by other people, and yet recognizes some of how she pushes people away.  Shared a couple of recent examples of when this happened, and it seems that the only reason she was aware of was that she was feeling uncomfortable with herself.  Discussed  different ways of handling is uncomfortable with herself, as well as what is creating the discomfort for her, which seemed to be helpful for patient and we plan to continue this more in another session as we ran out of time today.  Was not as negative with herself today and trying to move beyond her previous ways of feeling "less than" others.  She does show progress which is encouraging for her.  Patient to use journaling as a tool in between sessions and this seems to be helpful to her all as she comes into sessions sharing what her thoughts or writings have been.  Patient on courage to continue efforts to be more interactive with others, to give more time to her thoughts about her future education and choices she has, to practice more positive self talk daily, to maintain healthy boundaries with other people as needed, stay in the present focusing on what she can control, intentionally look for positives daily, to interrupt anxious/negative thoughts and replace with more reality-based thoughts that are empowering, continue working on increasing her comfort with meeting new people, get outside some daily, continue to refrain from any self cutting, remain in contact with people that are supportive of her, practice believing in herself more and more that she can make good decisions and make progress on her goals, to be patient with herself, and to feel good about the strength she is showing in the midst of indecision and challenging issues as she tries to focus on her goal-directed behaviors and move forward in a positive direction.   Goal review and progress/challenges noted with patient.  Next appointment within 2 to 3 weeks.   Mathis Fare, LCSW

## 2020-08-07 ENCOUNTER — Ambulatory Visit (INDEPENDENT_AMBULATORY_CARE_PROVIDER_SITE_OTHER): Payer: BC Managed Care – PPO | Admitting: Psychiatry

## 2020-08-07 ENCOUNTER — Other Ambulatory Visit: Payer: Self-pay

## 2020-08-07 DIAGNOSIS — F411 Generalized anxiety disorder: Secondary | ICD-10-CM | POA: Diagnosis not present

## 2020-08-07 NOTE — Progress Notes (Signed)
[facility] Counselor/Therapist Progress Note  Patient ID: Casey Santana, MRN: [id number],    Date: 08/07/2020  Time Spent: 60 minutes  Treatment Type: Individual Therapy  Reported Symptoms: anxiety, depression, tired  Mental Status Exam:  Appearance:   Casual and Neat     Behavior:  Appropriate and Sharing  Motor:  Normal  Speech/Language:   Clear and Coherent  Affect:  anxious, depression  Mood:  anxious and depressed  Thought process:  normal  Thought content:    WNL  Sensory/Perceptual disturbances:    WNL  Orientation:  oriented to person, place, time/date, situation, day of week, month of year and year  Attention:  Fair  Concentration:  Fair  Memory:  WNL  Fund of knowledge:   Good  Insight:    Good and Fair  Judgment:   Good  Impulse Control:  Good   Risk Assessment: Danger to Self:  No Self-injurious Behavior: No Danger to Others: No Duty to Warn:no Physical Aggression / Violence:No  Access to Firearms a concern: No  Gang Involvement:No   Subjective:  Patient in today reporting anxiety, depression, some tearfulness, and feeling tired and working long hours at times. Denies any SI, and has not done any self-cutting since last appt. States her issues today are more with anxiety and depression.  Feeling that her anxiety and depression have been some worse as there is current stressed feelings with parents re: patient's non-binary status and wanting name change which has created some issues and stress between patient and parents.Feels a lot of her depression stems from her anxiety. Concerned about relationship with parents and needing session to process her thoughts and feelings. Also processing her increased anxiety re: personal and work responsibilities, lack of socializing and social discomfort, don't want to bring up things in conversation due to fear of judgment, anxiety and my brain "doesn't have any downtime", fear of upcoming school session where she's  taking Fear of Public Speaking, not wanting to eat because I'm not hungry and food seems bland as I work in [employer].  Seemed to feel helped by being able to vent a lot of her thoughts and feelings however was more difficult to focus on some changes that may be helpful to her and that she would be committed to.  She does seem more depressed today (and with her decreased appetite, loss of interest, lack of social involvement, self judging, although denies current SI) and I am suggesting that she be in touch with her med provider regarding her medication to see if there may be any changes that might be helpful.  Focused heavily on more consistent positive/encouraging self-care and self talk with patient.  Will see patient again within 1-2 wks.   Interventions: Solution-Oriented/Positive Psychology, Ego-Supportive and Insight-Oriented  Diagnosis:   ICD-10-CM   1. Generalized anxiety disorder  F41.1      Treatment Goal Plan:  Patient not signing tx plan on computer screen due to Covid. Treatment Goals: Goal remain on tx plan as patient works with strategies to achieve her goals. Progress is noted each visit in "Progress" section of Plan. Long term goal: Develop healthy cognitive patterns and beliefs about self and the world that help alleviate depression/anxietyand any thoughts to harm self, and also help prevent relapse of depression. Short term goal: Identify and replace depressive thinking that leads to depressive/anxiousfeelings and actions.  Strategy: Identify depressive/anxious/negative thoughts and self-talk that lead to depression/anxiety and work interrupt those cognitions and replace with  more reality-based, positive, and empowering thoughts and self-talk.    Progress / Plan: Patient today presented as being more depressed with some increase in anxiety as well.  She did not follow through on homework but did do some writing about her depression and anxiety which we used in  session to help guide some of our conversation.  Less interaction socially, appetite lessened as food seems "bland", and less involvement in things she would normally enjoy, all seem to indicate the increase in depression.  She is contacting her med provider to see about getting an earlier appointment to see if any changes might be helpful.  She states that so far she has been remaining on her meds as prescribed and continues to deny SI.  Patient encouraged to get outside some daily, to connect with at least a friend each day, to practice more positive self talk and self-care, to maintain healthy boundaries with other people as needed, to stay in the present and focus on what she can control or change, interrupt anxious/negative thoughts and replace them with more reality-based thoughts, intentionally look for more positives daily, continue to refrain from any self-cutting, stay in contact with people that are supportive, continue work on her self esteem and self confidence believing in herself that she can make good decisions and make progress on her goals, to be patient with herself, and to recognize the strength she is showing in the midst of difficult feelings and situations as she tries to focus on goal-directed behaviors and improve her overall emotional health.   Goal review and progress/challenges noted with patient.  Next appointment within 2 to 3 weeks.   Mathis Fare, LCSW

## 2020-08-19 ENCOUNTER — Telehealth: Payer: Self-pay | Admitting: Adult Health

## 2020-08-19 ENCOUNTER — Other Ambulatory Visit: Payer: Self-pay

## 2020-08-19 DIAGNOSIS — F909 Attention-deficit hyperactivity disorder, unspecified type: Secondary | ICD-10-CM

## 2020-08-19 NOTE — Telephone Encounter (Signed)
Pt called I for refill on Adderall 30mg . States she has about four days left on prescription. Appt 6/15. Ph: [phone].  Pharmacy 7/15 [address] [place]

## 2020-08-19 NOTE — Telephone Encounter (Signed)
pended

## 2020-08-20 MED ORDER — AMPHETAMINE-DEXTROAMPHETAMINE 30 MG PO TABS: 30.0000 mg | ORAL_TABLET | Freq: Every day | ORAL | 0 refills | Status: DC

## 2020-08-21 ENCOUNTER — Ambulatory Visit (INDEPENDENT_AMBULATORY_CARE_PROVIDER_SITE_OTHER): Payer: BC Managed Care – PPO | Admitting: Psychiatry

## 2020-08-21 ENCOUNTER — Other Ambulatory Visit: Payer: Self-pay

## 2020-08-21 DIAGNOSIS — F331 Major depressive disorder, recurrent, moderate: Secondary | ICD-10-CM

## 2020-08-21 NOTE — Progress Notes (Signed)
[facility] Counselor/Therapist Progress Note  Patient ID: Casey Santana, MRN: [id number],    Date: 08/21/2020  Time Spent: 60 minutes  Treatment Type: Individual Therapy  Reported Symptoms: depression, anxiety, decreased motivation  Mental Status Exam:  Appearance:   Casual     Behavior:  Appropriate and Sharing  Motor:  Normal  Speech/Language:   Clear and Coherent  Affect:  anxiety, depression  Mood:  anxious and depressed  Thought process:  goal directed  Thought content:    some obsessiveness  Sensory/Perceptual disturbances:    WNL  Orientation:  oriented to person, place, time/date, situation, day of week, month of year and year  Attention:  Fair  Concentration:  Fair  Memory:  WNL  Fund of knowledge:   Good  Insight:    Good and Fair  Judgment:   Good and Fair  Impulse Control:  Good and Fair   Risk Assessment: Danger to Self:  No Self-injurious Behavior: No Danger to Others: No Duty to Warn:no Physical Aggression / Violence:No  Access to Firearms a concern: No  Gang Involvement:No   Subjective:   Patient in today reporting anxiety, depression, and some tearfulness and feeling tired.  Work hours not as long. Shared some writing she has done since last session. 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 Discussed more of her feelings that she may "be somewhere on the autism spectrum" due to lots of social difficulties throughout most of my life, difficulty functioning and keeping up in school. Tearful today and explains "I've always had a hard time talking about my feelings". Not feeling very understood by parents/family. Processed her history of difficulties in school and feeling like a disappointment. Planning"to be evaluated to see if I am autistic" and I shared some resources with her through the [facility]. Patient seemed  comfortable and venting her thoughts and feelings and gradually became much more emotionally grounded by session end.  She is to follow-up with homework in using journaling as a tool, which has proven to be helpful for her especially at the past couple of sessions.  Interventions: Cognitive Behavioral Therapy, Ego-Supportive and Insight-Oriented  Diagnosis:   ICD-10-CM   1. Major depressive disorder, recurrent episode, moderate (HCC)  F33.1     Treatment goal Plan: Patient not signing tx plan on computer screen due to Covid. Treatment Goals: Goal remain on tx plan as patient works with strategies to achieve her goals. Progress is noted each visit in "Progress" section of Plan. Long term goal: Develop healthy cognitive patterns and beliefs about self and the world that help alleviate depression/anxietyand any thoughts to harm self, and also help prevent relapse of depression. Short term goal: Identify and replace depressive thinking that leads to depressive/anxiousfeelings and actions.  Strategy: Identify depressive/anxious/negative thoughts and self-talk that lead to depression/anxiety and work interrupt those cognitions and replace with more reality-based, positive, and empowering thoughts and self-talk.    Progress / Plan: Patient showed good strength encouraged in session today as it is difficult for her in sharing feelings and emotions with anyone and she is doing that more in sessions which is healthy for her.  Is feeling better physically than at last session.  Emotionally she was more volatile but the more she spoke, the calmer and more settled she became.  She did follow through and my suggestion for her  to contact her med provider regarding some concerns she had about her meds and she is to see that provider next week.  She remains on her meds as prescribed for now and was also encouraged to connect more with friends including at least 1 contact per day, to maintain healthy  boundaries with others as needed, to get outside some daily and walk, to practice more consistent positive self talk and positive self-care, intentionally look for more positives daily, stay in the present focusing on what she can control or change, continue to refrain from any self-cutting which she is doing really well with, interrupt anxious/negative thoughts and replace them with more realistic and empowering thoughts, continue work on her own self esteem and self confidence believing in herself that she can make good decisions and make progress on her goals, stay in contact with supportive people, and to feel good about the strength she is showing in the midst of difficult feelings and difficult and often changing circumstances as she works with goal-directed behaviors in trying to move forward in a more positive direction and improve her overall emotional health.  Goal review and progress/challenges noted with patient.  Next appointment within 2 weeks.  Mathis Fare, LCSW

## 2020-08-28 ENCOUNTER — Other Ambulatory Visit: Payer: Self-pay

## 2020-08-28 ENCOUNTER — Ambulatory Visit: Payer: BC Managed Care – PPO | Admitting: Adult Health

## 2020-08-28 ENCOUNTER — Encounter: Payer: Self-pay | Admitting: Adult Health

## 2020-08-28 DIAGNOSIS — F411 Generalized anxiety disorder: Secondary | ICD-10-CM | POA: Diagnosis not present

## 2020-08-28 DIAGNOSIS — F331 Major depressive disorder, recurrent, moderate: Secondary | ICD-10-CM

## 2020-08-28 DIAGNOSIS — F909 Attention-deficit hyperactivity disorder, unspecified type: Secondary | ICD-10-CM

## 2020-08-28 MED ORDER — BUPROPION HCL ER (XL) 150 MG PO TB24: ORAL_TABLET | ORAL | 2 refills | Status: DC

## 2020-08-28 MED ORDER — AMPHETAMINE-DEXTROAMPHETAMINE 30 MG PO TABS: 30.0000 mg | ORAL_TABLET | Freq: Every day | ORAL | 0 refills | Status: DC

## 2020-08-28 NOTE — Progress Notes (Signed)
Casey Santana [id number] 04/02/2000 19 y.o.  Subjective:   Patient ID:  Casey Santana is a 20 y.o. (DOB [birth year]) adult.  Chief Complaint: No chief complaint on file.   HPI Casey Santana presents to the office today for follow-up of MDD, ADHD, and GAD.  Describes mood today as "so-so". Mood symptoms - reports depression. Decreased anxiety and irritability. Stating "I'm having issues with motivation". Feels like the Adderall helps with energy, but is still missing the "want" to get things done. Continues to work at [employer]. Taking classes - not sure of "path". Willing to make a medication adjustment  - add Wellbutrin to help with the lack of interest and motivation. Seeing therapist Rockne Menghini. Improved interest and motivation. Taking medications as prescribed.  Energy levels stable. Active, does not have a regular exercise routine.  Enjoys some usual interests and activities. Single. Has a boyfriend. Lives at home with parents and 2 dogs - "Candy Sledge and Perlie Gold" - Cat passed away in 05-04-22. Has a 45 year old brother. Spending time with family.  Appetite adequate. Weight stable - 126 pounds. Sleeps well most nights. Averages 7 to 8 hours. Focus and concentration stable. Completing tasks. Managing aspects of household. Work going well. Works 25 to 30 hours a week at [employer]. Taking 3 classes at [employer]. Denies self harm since September. Denies SI or HI.  Denies AH or VH. Denies substance use, over spending, or reckless behaviors.   Previous medication trials: Lexapro, Prozac, Propanolol, Cymbalta    GAD-7    Flowsheet Row Office Visit from 02/13/2019 in [facility] from 02/24/2018 in [facility]  Total GAD-7 Score 14 9      PHQ2-9    Flowsheet Row Office Visit from 06/19/2020 in [facility] from 05/12/2019 in [facility] from  04/27/2019 in [facility] from 02/13/2019 in [facility] from 02/24/2018 in [facility]  PHQ-2 Total Score 0 4 4 2 3   PHQ-9 Total Score -- 19 20 19 11       Flowsheet Row ED from 07/11/2020 in [facility] Office Visit from 02/13/2019 in [facility]  C-SSRS RISK CATEGORY No Risk Error: Question 6 not populated        Review of Systems:  Review of Systems  Musculoskeletal:  Negative for gait problem.  Neurological:  Negative for tremors.  Psychiatric/Behavioral:         Please refer to HPI   Medications: I have reviewed the patient's current medications.  Current Outpatient Medications  Medication Sig Dispense Refill   buPROPion (WELLBUTRIN XL) 150 MG 24 hr tablet Take one tablet daily x 7 days, then increase to two tablets daily. 60 tablet 2   amphetamine-dextroamphetamine (ADDERALL) 30 MG tablet Take 1 tablet by mouth daily. 30 tablet 0   amphetamine-dextroamphetamine (ADDERALL) 30 MG tablet Take 1 tablet by mouth daily. (Patient not taking: Reported on 06/19/2020) 30 tablet 0   amphetamine-dextroamphetamine (ADDERALL) 30 MG tablet Take 1 tablet by mouth daily. 30 tablet 0   EPINEPHrine (EPIPEN 2-PAK) 0.3 mg/0.3 mL IJ SOAJ injection Inject 0.3 mLs (0.3 mg total) into the muscle as needed for anaphylaxis. 1 each 2   propranolol (INDERAL) 10 MG tablet Take 1 tablet (10 mg total) by mouth 2 (two) times daily. 60 tablet 5   No current facility-administered medications for  this visit.    Medication Side Effects: None  Allergies:  Allergies  Allergen Reactions   Peanut-Containing Drug Products     Past Medical History:  Diagnosis Date   Anxiety    Depression    Dysmenorrhea    Heart murmur     Past Medical History, Surgical history, Social history, and Family history were reviewed and updated as appropriate.    Please see review of systems for further details on the patient's review from today.   Objective:   Physical Exam:  There were no vitals taken for this visit.  Physical Exam Constitutional:      General: Casey E Pree "MICAH" is not in acute distress. Musculoskeletal:        General: No deformity.  Neurological:     Mental Status: Casey E Hua "MICAH" is alert and oriented to person, place, and time.     Coordination: Coordination normal.  Psychiatric:        Attention and Perception: Attention and perception normal. Casey E Main "MICAH" does not perceive auditory or visual hallucinations.        Mood and Affect: Mood normal. Mood is not anxious or depressed. Affect is not labile, blunt, angry or inappropriate.        Speech: Speech normal.        Behavior: Behavior normal.        Thought Content: Thought content normal. Thought content is not paranoid or delusional. Thought content does not include homicidal or suicidal ideation. Thought content does not include homicidal or suicidal plan.        Cognition and Memory: Cognition and memory normal.        Judgment: Judgment normal.     Comments: Insight intact    Lab Review:     Component Value Date/Time   NA 139 07/11/2020 1738   K 3.4 (L) 07/11/2020 1738   CL 106 07/11/2020 1738   CO2 25 07/11/2020 1738   GLUCOSE 100 (H) 07/11/2020 1738   BUN 8 07/11/2020 1738   CREATININE 0.68 07/11/2020 1738   CALCIUM 9.9 07/11/2020 1738   GFRNONAA >60 07/11/2020 1738       Component Value Date/Time   WBC 5.4 07/11/2020 1738   RBC 4.24 07/11/2020 1738   HGB 13.0 07/11/2020 1738   HCT 37.8 07/11/2020 1738   PLT 207 07/11/2020 1738   MCV 89.2 07/11/2020 1738   MCH 30.7 07/11/2020 1738   MCHC 34.4 07/11/2020 1738   RDW 12.0 07/11/2020 1738   LYMPHSABS 2.2 05/23/2020 0828   MONOABS 0.4 05/23/2020 0828   EOSABS 0.2 05/23/2020 0828   BASOSABS 0.1 05/23/2020 0828    No results found for: POCLITH, LITHIUM   No results  found for: PHENYTOIN, PHENOBARB, VALPROATE, CBMZ   .res Assessment: Plan:    Plan:  PDMP reviewed  1. Adderall 30mg  daily. 2. Propanolol 10mg  BID for anxiety 3. Add Wellbutrin XL 150mg  daily x 7 days, then increase to 300mg  daily.  40/58 Attention Deficit Disorder likely  Meets DSM 5 criteria for ADHD  RTC 4 weeks  Continue therapy with  Patient advised to contact office with any questions, adverse effects, or acute worsening in signs and symptoms.   Diagnoses and all orders for this visit:  Major depressive disorder, recurrent episode, moderate (HCC) -     buPROPion (WELLBUTRIN XL) 150 MG 24 hr tablet; Take one tablet daily x 7 days, then increase to two tablets daily.  Attention deficit hyperactivity disorder (  ADHD), unspecified ADHD type -     amphetamine-dextroamphetamine (ADDERALL) 30 MG tablet; Take 1 tablet by mouth daily.  Generalized anxiety disorder -     buPROPion (WELLBUTRIN XL) 150 MG 24 hr tablet; Take one tablet daily x 7 days, then increase to two tablets daily.    Please see After Visit Summary for patient specific instructions.  Future Appointments  Date Time Provider Department Center  09/04/2020  1:00 PM Mathis Fare, LCSW [facility] None  09/18/2020 11:00 AM Mathis Fare, LCSW [facility] None  09/25/2020  1:20 PM Deziree Mokry, Thereasa Solo, NP [facility] None  10/02/2020  1:00 PM Mathis Fare, LCSW [facility] None  10/15/2020  1:00 PM Mathis Fare, LCSW [facility] None  10/30/2020  1:00 PM Mathis Fare, LCSW [facility] None  05/26/2021  2:00 PM Willow Ora, MD [facility]    No orders of the defined types were placed in this encounter.   -------------------------------

## 2020-09-04 ENCOUNTER — Other Ambulatory Visit: Payer: Self-pay

## 2020-09-04 ENCOUNTER — Ambulatory Visit: Payer: BC Managed Care – PPO | Admitting: Psychiatry

## 2020-09-04 DIAGNOSIS — F331 Major depressive disorder, recurrent, moderate: Secondary | ICD-10-CM

## 2020-09-04 NOTE — Progress Notes (Signed)
[facility] Counselor/Therapist Progress Note  Patient ID: Casey Santana, MRN: [id number],    Date: 09/04/2020  Time Spent: 53 minutes   Treatment Type: Individual Therapy  Reported Symptoms: anxiety, depression, lack of motivation, stressed with school work  Mental Status Exam:  Appearance:   Casual     Behavior:  Appropriate and Sharing  Motor:  Normal  Speech/Language:   Clear and Coherent  Affect:  anxious  Mood:  anxious and some depression  Thought process:  goal directed  Thought content:    Some obsessiveness  Sensory/Perceptual disturbances:    WNL  Orientation:  oriented to person, place, time/date, situation, day of week, month of year, and year  Attention:  Good  Concentration:  Good and Fair  Memory:  WNL  Fund of knowledge:   Good  Insight:    Good and Fair  Judgment:   Good  Impulse Control:  Good   Risk Assessment: Danger to Self:  No Self-injurious Behavior: No Danger to Others: No Duty to Warn:no Physical Aggression / Violence:No  Access to Firearms a concern: No  Gang Involvement:No   Subjective:  Patient in today reporting anxiety, depression (some better), and very little tearfulness, and stressed with school work. 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 Distressed about world events, potential recession, violence and safety issues, and "abortion issues currently".Reports anxiety in several main areas recently including: Health anxiety,driving anxiety, escalated prices going up, and all the chaos and things happening in the world.  Discussed these in more detail during session today and s she eemed to feel more calm and grounded just talking them through with someone and receiving support.  She acknowledges that her anxiety was increasing with the fact she felt alone with a lot of what she was feeling and she is limited as to who all she talks with which sometimes leads her to feel more isolated and alone.   States that she is staying on her medication most days and then she forgets to take it, which I encouraged her to come up with a plan to help her remember whether it is through leaving herself a note that is real visible or setting an alarm on her phone.  I encouraged patient to make some written notes before next session to help prioritize our talking together so as to make best use of our time together and working on treatment goal plans to help her in making more progress and moving forward.  She is undecided about "testing for autism symptoms" and plans to talk with her mother about that in the near future.   Interventions: Cognitive Behavioral Therapy, Solution-Oriented/Positive Psychology, and Insight-Oriented  Diagnosis:   ICD-10-CM   1. Major depressive disorder, recurrent episode, moderate (HCC)  F33.1       Plan:  Patient today reporting feeling less motivated today and was stressed with school work and her anxious feelings about 4 areas noted above in "subjective" section of note, which were discussed in session today. Although she was not as motivated, she did participate well.  Seems to need a little more structure and I offered her homework assignment to make a priority list of things that she wants to add to next session.  She did show good strength today in sharing her thoughts and feelings about several areas of anxiety for her as well as some in reference to family relationships.  Also able to share some of "  what worked and what did not work and trying to manage anxiety better".  Seems to retain suggestions and strategies better when she also writes them down so going forward, she is encouraged to make notes each session and she seems to feel this would also be helpful.  Not as volatile emotionally today.  Encouraged patient to practice several behaviors that she has before in between sessions and they seemed helpful including maintain healthy boundaries with others, get outside daily  and walk, be in touch with at least 2 friends once per day, practice more consistent positive self talk and positive self-care, continue to refrain from any self-cutting with which she is doing really well, interrupt anxious/negative thoughts and replacing with more realistic and empowering thoughts, intentionally look for more positives daily, remain in the present focusing on what she can control, continue work on her self esteem and self confidence believing in herself that she can make good decisions and make progress on her goals, stay in contact with supportive people, and to recognize the strengths she is showing in the midst of challenging circumstances as she works with goal-directed behaviors to move forward in a more positive direction.  Added note: She is doing quite well and her refraining from self cutting as she has gone 8 months without any cutting which is real progress for her.   Goal review and progress/challenges noted with patient.  Next app within 2 weeks.   Mathis Fare, LCSW

## 2020-09-18 ENCOUNTER — Ambulatory Visit: Payer: BC Managed Care – PPO | Admitting: Psychiatry

## 2020-09-18 NOTE — Progress Notes (Unsigned)
      [facility] Counselor/Therapist Progress Note  Patient ID: SHANEN NORRIS, MRN: [id number],    Date: 09/18/2020  Time Spent: ***   Treatment Type: {CHL AMB THERAPY TYPES:4634220353}  Reported Symptoms: ***  Mental Status Exam:  Appearance:   {PSY:22683}     Behavior:  {PSY:21022743}  Motor:  {PSY:22302}  Speech/Language:   {PSY:22685}  Affect:  {PSY:22687}  Mood:  {PSY:31886}  Thought process:  {PSY:31888}  Thought content:    {PSY:[phone]}  Sensory/Perceptual disturbances:    {PSY:5088824697}  Orientation:  {PSY:30297}  Attention:  {PSY:22877}  Concentration:  {PSY:([phone] 159-0851}  Memory:  {[phone]}  Fund of knowledge:   {PSY:([phone] 159-0851}  Insight:    {PSY:([phone]) 159-0851}  Judgment:   {PSY:([phone]) 159-0851}  Impulse Control:  {PSY:([phone] 159-0851}   Risk Assessment: Danger to Self:  {PSY:22692} Self-injurious Behavior: {PSY:22692} Danger to Others: {PSY:22692} Duty to Warn:{PSY:311194} Physical Aggression / Violence:{PSY:21197} Access to Firearms a concern: {PSY:21197} Gang Involvement:{PSY:21197}  Subjective: ***   Interventions: {PSY:[phone]}  Diagnosis:No diagnosis found.  Plan: ***  Mathis Fare, LCSW

## 2020-09-25 ENCOUNTER — Encounter: Payer: Self-pay | Admitting: Adult Health

## 2020-09-25 ENCOUNTER — Other Ambulatory Visit: Payer: Self-pay

## 2020-09-25 ENCOUNTER — Ambulatory Visit: Payer: BC Managed Care – PPO | Admitting: Adult Health

## 2020-09-25 DIAGNOSIS — F331 Major depressive disorder, recurrent, moderate: Secondary | ICD-10-CM | POA: Diagnosis not present

## 2020-09-25 DIAGNOSIS — F909 Attention-deficit hyperactivity disorder, unspecified type: Secondary | ICD-10-CM | POA: Diagnosis not present

## 2020-09-25 DIAGNOSIS — F411 Generalized anxiety disorder: Secondary | ICD-10-CM | POA: Diagnosis not present

## 2020-09-25 MED ORDER — PROPRANOLOL HCL 10 MG PO TABS: 10.0000 mg | ORAL_TABLET | Freq: Two times a day (BID) | ORAL | 5 refills | Status: DC

## 2020-09-25 MED ORDER — ARIPIPRAZOLE 2 MG PO TABS: 2.0000 mg | ORAL_TABLET | Freq: Every day | ORAL | 2 refills | Status: DC

## 2020-09-25 MED ORDER — AMPHETAMINE-DEXTROAMPHETAMINE 30 MG PO TABS: 30.0000 mg | ORAL_TABLET | Freq: Every day | ORAL | 0 refills | Status: DC

## 2020-09-25 NOTE — Progress Notes (Signed)
Casey Santana [id number] August 22, 2000 19 y.o.  Subjective:   Patient ID:  Casey Santana is a 20 y.o. (DOB June 20, 2000) adult.  Chief Complaint: No chief complaint on file.   HPI Casey Santana presents to the office today for follow-up of MDD, ADHD, and GAD.  Describes mood today as "so-so". Mood symptoms - reports depression. Tried the Wellbutrin, but it caused dizziness and was not tolerated. Reports anxiety and irritability. Continues to have issues with motivation.  Feels like the Adderall helps with energy. Works at [employer]. Taking classes - one summer school class. Seeing therapist Rockne Menghini. Improved interest and motivation. Taking medications as prescribed.  Energy levels stable. Active, does not have a regular exercise routine.  Enjoys some usual interests and activities. Single. Has a boyfriend. Lives at home with parents and 2 dogs - "Pomona and Perlie Gold". Has a 72 year old brother - [place]. Spending time with family.  Appetite adequate. Weight stable - 126 pounds. Sleeps well most nights. Averages 7 to 8 hours. Focus and concentration stable. Completing tasks. Managing aspects of household. Work going well. Works 25 to 30 hours a week at [employer]. Taking 1 class currently. Denies self harm since September. Denies SI or HI.  Denies AH or VH. Denies substance use, over spending, or reckless behaviors.   Previous medication trials: Lexapro, Prozac, Propanolol, Cymbalta, Wellbutrin XL   GAD-7    Flowsheet Row Office Visit from 02/13/2019 in [facility] from 02/24/2018 in [facility]  Total GAD-7 Score 14 9      PHQ2-9    Flowsheet Row Office Visit from 06/19/2020 in [facility] from 05/12/2019 in [facility] from 04/27/2019 in [facility] from 02/13/2019 in [facility] from 02/24/2018 in [facility]  PHQ-2 Total Score 0 4 4 2 3   PHQ-9 Total Score -- 19 20 19 11       Flowsheet Row ED from 07/11/2020 in [facility] Office Visit from 02/13/2019 in [facility]  C-SSRS RISK CATEGORY No Risk Error: Question 6 not populated        Review of Systems:  Review of Systems  Musculoskeletal:  Negative for gait problem.  Neurological:  Negative for tremors.  Psychiatric/Behavioral:         Please refer to HPI   Medications: I have reviewed the patient's current medications.  Current Outpatient Medications  Medication Sig Dispense Refill   ARIPiprazole (ABILIFY) 2 MG tablet Take 1 tablet (2 mg total) by mouth daily. 30 tablet 2   amphetamine-dextroamphetamine (ADDERALL) 30 MG tablet Take 1 tablet by mouth daily. 30 tablet 0   amphetamine-dextroamphetamine (ADDERALL) 30 MG tablet Take 1 tablet by mouth daily. (Patient not taking: Reported on 06/19/2020) 30 tablet 0   amphetamine-dextroamphetamine (ADDERALL) 30 MG tablet Take 1 tablet by mouth daily. 30 tablet 0   EPINEPHrine (EPIPEN 2-PAK) 0.3 mg/0.3 mL IJ SOAJ injection Inject 0.3 mLs (0.3 mg total) into the muscle as needed for anaphylaxis. 1 each 2   propranolol (INDERAL) 10 MG tablet Take 1 tablet (10 mg total) by mouth 2 (two) times daily. 60 tablet 5   No current facility-administered medications for this visit.    Medication Side Effects: None  Allergies:  Allergies  Allergen Reactions   Peanut-Containing Drug Products     Past Medical History:  Diagnosis Date   Anxiety    Depression    Dysmenorrhea    Heart murmur     Past Medical History, Surgical history, Social history, and Family history were reviewed and updated as appropriate.   Please see review of systems for further details on the patient's review from today.   Objective:   Physical Exam:  There were no vitals  taken for this visit.  Physical Exam Constitutional:      General: Casey E Keiper "MICAH" is not in acute distress. Musculoskeletal:        General: No deformity.  Neurological:     Mental Status: Casey E Obarr "MICAH" is alert and oriented to person, place, and time.     Coordination: Coordination normal.  Psychiatric:        Attention and Perception: Attention and perception normal. Casey E Wedekind "MICAH" does not perceive auditory or visual hallucinations.        Mood and Affect: Mood normal. Mood is not anxious or depressed. Affect is not labile, blunt, angry or inappropriate.        Speech: Speech normal.        Behavior: Behavior normal.        Thought Content: Thought content normal. Thought content is not paranoid or delusional. Thought content does not include homicidal or suicidal ideation. Thought content does not include homicidal or suicidal plan.        Cognition and Memory: Cognition and memory normal.        Judgment: Judgment normal.     Comments: Insight intact    Lab Review:     Component Value Date/Time   NA 139 07/11/2020 1738   K 3.4 (L) 07/11/2020 1738   CL 106 07/11/2020 1738   CO2 25 07/11/2020 1738   GLUCOSE 100 (H) 07/11/2020 1738   BUN 8 07/11/2020 1738   CREATININE 0.68 07/11/2020 1738   CALCIUM 9.9 07/11/2020 1738   GFRNONAA >60 07/11/2020 1738       Component Value Date/Time   WBC 5.4 07/11/2020 1738   RBC 4.24 07/11/2020 1738   HGB 13.0 07/11/2020 1738   HCT 37.8 07/11/2020 1738   PLT 207 07/11/2020 1738   MCV 89.2 07/11/2020 1738   MCH 30.7 07/11/2020 1738   MCHC 34.4 07/11/2020 1738   RDW 12.0 07/11/2020 1738   LYMPHSABS 2.2 05/23/2020 0828   MONOABS 0.4 05/23/2020 0828   EOSABS 0.2 05/23/2020 0828   BASOSABS 0.1 05/23/2020 0828    No results found for: POCLITH, LITHIUM   No results found for: PHENYTOIN, PHENOBARB, VALPROATE, CBMZ   .res Assessment: Plan:    Plan:  PDMP reviewed  1. Adderall 30mg  daily. 2.  Propanolol 10mg  BID for anxiety 3. D/C Wellbutrin XL 150mg  daily x 7 days, then increase to 300mg  daily. 4. Add Abilify 2mg  daily  111/66/85  40/58 Attention Deficit Disorder likely  Meets DSM 5 criteria for ADHD  RTC 4 weeks  Continue therapy with  Patient advised to contact office with any questions, adverse effects, or acute worsening in signs and symptoms.   Diagnoses and all orders for this visit:  Generalized anxiety disorder -     propranolol (INDERAL) 10 MG tablet; Take 1 tablet (10 mg total) by mouth 2 (two) times daily. -     ARIPiprazole (ABILIFY) 2 MG tablet; Take 1 tablet (2 mg total) by mouth daily.  Attention deficit hyperactivity disorder (ADHD), unspecified ADHD type -     amphetamine-dextroamphetamine (ADDERALL)  30 MG tablet; Take 1 tablet by mouth daily.  Major depressive disorder, recurrent episode, moderate (HCC) -     ARIPiprazole (ABILIFY) 2 MG tablet; Take 1 tablet (2 mg total) by mouth daily.    Please see After Visit Summary for patient specific instructions.  Future Appointments  Date Time Provider Department Center  10/02/2020  1:00 PM Mathis Fare, LCSW [facility] None  10/15/2020  1:00 PM Mathis Fare, LCSW [facility] None  10/30/2020  1:00 PM Mathis Fare, LCSW [facility] None  11/13/2020  1:00 PM Mathis Fare, LCSW [facility] None  05/26/2021  2:00 PM Willow Ora, MD [facility]    No orders of the defined types were placed in this encounter.   -------------------------------

## 2020-10-02 ENCOUNTER — Other Ambulatory Visit: Payer: Self-pay

## 2020-10-02 ENCOUNTER — Ambulatory Visit: Payer: BC Managed Care – PPO | Admitting: Psychiatry

## 2020-10-02 DIAGNOSIS — F411 Generalized anxiety disorder: Secondary | ICD-10-CM | POA: Diagnosis not present

## 2020-10-02 NOTE — Progress Notes (Signed)
[facility] Counselor/Therapist Progress Note  Patient ID: Casey Santana, MRN: [id number],    Date: 10/02/2020  Time Spent: 58 minutes   Treatment Type: Individual Therapy  Reported Symptoms: anxiety, low motivation, depression  Mental Status Exam:  Appearance:   Casual     Behavior:  Appropriate and Sharing  Motor:  Normal  Speech/Language:   Clear and Coherent  Affect:  Anxious, depressed  Mood:  anxious and depressed  Thought process:  goal directed  Thought content:    obsessiveness  Sensory/Perceptual disturbances:    WNL  Orientation:  oriented to person, place, time/date, situation, day of week, month of year, year, and stated date of October 02, 2020  Attention:  Fair  Concentration:  Fair  Memory:  WNL  Fund of knowledge:   Good  Insight:    Good  Judgment:   Good  Impulse Control:  Fair   Risk Assessment: Danger to Self:  No Self-injurious Behavior: No Danger to Others: No Duty to Warn:no Physical Aggression / Violence:No  Access to Firearms a concern: No  Gang Involvement:No   Subjective:  Patient in today reporting anxiety, depression, and lower motivation. "Sometimes has thoughts to harm self but never harming self." Still no more self-cutting, as it's been 9 1/2 months since she last did any self-cutting, as patient keeps this on her phone. Feel like "a lot of my depression is because of feeling like I can't do anything, because I have trouble focusing and it's hard to find things I really want to do. Anxiety is stronger than depression.  Definitely seemed more anxious and also hard to focus in session today.  She mentioned that this appointment was much later than usual as we normally have 2 weeks in between and this time it was a month before she was able to get back in due to to having to miss her last appointment.  States her anxiety is mostly about "the future, some social anxiety (more at work), school, and home." Did follow through on homework of  making notes and bringing with her to session today.  One of her questions from this homework assignment was "why am I so good with internet friends and so bad with real-life friends?"  Thought this was a good question for her to present as it fits right in with some of her anxiety and.  We processed a lot of her thoughts and feelings in session today and she did show some good effort in sharing more of her self-esteem issues and "fitting in" and having a limited range of interaction with others in her age group as she limits the people with whom she speaks and opens up with.  Self doubt also discussed as well as how she can work to be more confident and not as self judging, as these traits lead her to feel more isolated and alone and "very different" from everyone else.  We ran out of time and I asked patient to continue sharing some of her feelings by doing more written homework and bring it in next session.  I find that she does seem more comfortable being open when it is in writing first and then is spoken in session.    Interventions: Cognitive Behavioral Therapy and Solution-Oriented/Positive Psychology  Diagnosis:   ICD-10-CM   1. Generalized anxiety disorder  F41.1       Plan of Care:  Patient not signing tx plan on computer screen due to Covid.  Treatment  Goals: Goal remain on tx plan as patient works with strategies to achieve her goals.  Progress is noted each visit in "Progress" section of Plan. Long term goal: Develop healthy cognitive patterns and beliefs about self and the world that help alleviate depression/anxiety and any thoughts to harm self, and also help prevent relapse of depression. Short term goal: Identify and replace depressive thinking that leads to depressive/anxious feelings and actions. Strategy: Identify depressive/anxious/negative thoughts and self-talk that lead to depression/anxiety and work interrupt those cognitions and replace with more reality-based,  positive, and empowering thoughts and self-talk.   Plan:  Patient today showing less motivation initially and she acknowledged this upfront.  It was obvious however, the more we spoke and opened up the conversation she became more motivated.  Will be starting back to school soon according to her report and is not looking forward to that but knows that she needs that in order to move forward "in the work world".  Showed more strength today in sharing her thoughts and feelings about her anxiety and how she feels "different from others".  Also some insight as to how she can easily make assumptions about herself that are negative, and compare herself to others.  Does not show volatility in her emotions today.  Encouraged patient to practice some behaviors that she has used before between sessions which seemed helpful including: Getting outside daily and walk, be in touch with at least 2 friends per day, set and keep appropriate boundaries with others, practice more consistent positive self talk and self-care, continue to refrain from any self cutting with which she is doing really well, remain in the present focusing on what she can control, intentionally look for more positives each day, interrupt anxious/negative thoughts and replace with more realistic and empowering thoughts, look for the positives within herself, continue work on her self esteem and self confidence believing in herself that she can make good decisions and make progress on her goals, stay in contact with supportive people, and to feel good about the strength she is showing in the midst of challenging circumstances as she works with goal-directed behaviors to move forward in a more positive direction of improved emotional health.  Goal review and progress/challenges noted with patient.  Next appointment within 2 to 3 weeks.   Mathis Fare, LCSW

## 2020-10-14 ENCOUNTER — Telehealth: Payer: Self-pay

## 2020-10-14 NOTE — Telephone Encounter (Signed)
LAST APPOINTMENT DATE:  06/19/20  NEXT APPOINTMENT DATE:05/26/21  MEDICATION:EPINEPHrine (EPIPEN 2-PAK) 0.3 mg/0.3 mL IJ SOAJ injection  PHARMACY:[facility] [id number] - [place], [place] - [address]

## 2020-10-15 ENCOUNTER — Other Ambulatory Visit: Payer: Self-pay

## 2020-10-15 ENCOUNTER — Ambulatory Visit: Payer: BC Managed Care – PPO | Admitting: Psychiatry

## 2020-10-15 DIAGNOSIS — F411 Generalized anxiety disorder: Secondary | ICD-10-CM | POA: Diagnosis not present

## 2020-10-15 MED ORDER — EPINEPHRINE 0.3 MG/0.3ML IJ SOAJ: 0.3000 mg | INTRAMUSCULAR | 2 refills | Status: AC | PRN

## 2020-10-15 NOTE — Progress Notes (Signed)
[facility] Counselor/Therapist Progress Note  Patient ID: Casey Santana, MRN: [id number],    Date: 10/15/2020  Time Spent: 58 minutes   Treatment Type: Individual Therapy  Reported Symptoms: Anxiety, depressed, lower motivation at work  Mental Status Exam:  Appearance:   Casual and Neat     Behavior:  Appropriate, Sharing, and Motivated  Motor:  Normal  Speech/Language:   Clear and Coherent  Affect:  Anxious, depressed  Mood:  anxious and depressed  Thought process:  goal directed  Thought content:    Obsessions  Sensory/Perceptual disturbances:    WNL  Orientation:  oriented to person, place, time/date, situation, day of week, month of year, year, and stated date of Aug. 2, 2022  Attention:  Fair  Concentration:  Fair  Memory:  WNL  Fund of knowledge:   Good  Insight:    Good and Fair  Judgment:   Good  Impulse Control:  Good and Fair   Risk Assessment: Danger to Self:  No Self-injurious Behavior: No Danger to Others: No Duty to Warn:no Physical Aggression / Violence:No  Access to Firearms a concern: No  Gang Involvement:No   Subjective:  Patient in today reporting anxiety, depression, and lower motivation at work. Has not self-cut any in 10 mos and 5 days, as she keeps a record on her phone. Things going well with BF. Worked today on her overthinking about herself, relationships, and "everything else" as well her anxiety and lack of motivation especially around topic of school as "I really want to get through school and have a career." Talked in more detail today about how  her anxiety and lack of motivation are possibly related, and notes that both symptoms "are not typically as bad when I take my ADHD medication".  Did her homework and brought in a short written list of her concerns which was mostly about this.  States she forgets her meds sometimes and at other times "does not want to have to take them".  We discussed this in more detail in session today and I  think helped her arrive at a better mindset about her medications and how they can influence her overall quality of life, being more focused, more productive, and end up feeling better about herself.  Not as depressed today but more anxious and having difficulty focusing, as noted above, influenced by missing her medication.  She states she does have a supply and that she will be more regular in taking it, which led to it was talking about some strategies she could use to make sure she does not miss it including positive ways of reminding herself.    Interventions: Cognitive Behavioral Therapy and Insight-Oriented  Diagnosis:   ICD-10-CM   1. Generalized anxiety disorder  F41.1        Plan of Care:  Patient not signing tx plan on computer screen due to Covid.  Treatment Goals: Goal remain on tx plan as patient works with strategies to achieve her goals.  Progress is noted each visit in "Progress" section of Plan. Long term goal: Develop healthy cognitive patterns and beliefs about self and the world that help alleviate depression/anxiety and any thoughts to harm self, and also help prevent relapse of depression. Short term goal: Identify and replace depressive thinking that leads to depressive/anxious feelings and actions. Strategy: Identify depressive/anxious/negative thoughts and self-talk that lead to depression/anxiety and work interrupt those cognitions and replace with more reality-based, positive, and empowering thoughts and self-talk.  Plan:  Patient today showing lowered motivation initially but did improve her motivation over the course of session today.  She follow through on homework from last visit and actually became very engaged in session.  Will be starting a couple more courses soon and trying to get her first 2 years of college behind her.  Does talk of possibly going into the field of speech pathology for elementary school children, however has some reservations about  all the schooling involved.  Reports actually doing pretty well most recently in courses and hopes to get off to a good start in her next courses coming up fall.  Insight still better especially when talking about some of her behaviors and choices she makes.  Some tearfulness as we spoke about her believing more in herself but she stayed on topic and did not veer off, like happens sometimes when emotions arise.  Encouraged patient to use some behaviors that have proven to be helpful before including practice more consistent positive self talk and self-care, get outside daily and walk, set and keep appropriate boundaries with others, be in touch with at least 2 friends per day, continue to refrain from any self cutting with which she is doing really well, remain in the present focusing on what she can control, interrupt anxious/negative thoughts and replace with more realistic and empowering thoughts, intentionally look for more positives each day, look for the positives within herself, continue work on her self esteem and self confidence believing in herself that she can make good decisions and make progress on her goals, stay in contact with supportive people, and recognize the strength she is showing in the midst of challenging circumstances as she works with goal-directed behaviors to move forward in a more positive direction of improved emotional health and stability.   Goal review and progress/challenges noted with patient.  Next appointment within 2 to 3 weeks.   Mathis Fare, LCSW

## 2020-10-15 NOTE — Telephone Encounter (Signed)
Refill sent.

## 2020-10-17 ENCOUNTER — Ambulatory Visit: Payer: BC Managed Care – PPO | Admitting: Adult Health

## 2020-10-21 ENCOUNTER — Encounter: Payer: Self-pay | Admitting: Adult Health

## 2020-10-21 ENCOUNTER — Other Ambulatory Visit: Payer: Self-pay

## 2020-10-21 ENCOUNTER — Ambulatory Visit: Payer: BC Managed Care – PPO | Admitting: Adult Health

## 2020-10-21 DIAGNOSIS — F411 Generalized anxiety disorder: Secondary | ICD-10-CM

## 2020-10-21 DIAGNOSIS — F331 Major depressive disorder, recurrent, moderate: Secondary | ICD-10-CM

## 2020-10-21 DIAGNOSIS — F909 Attention-deficit hyperactivity disorder, unspecified type: Secondary | ICD-10-CM | POA: Diagnosis not present

## 2020-10-21 NOTE — Progress Notes (Signed)
TYAN LASURE [id number] 2000/07/17 19 y.o.  Subjective:   Patient ID:  Casey Santana is a 20 y.o. (DOB [birth year]) adult.  Chief Complaint: No chief complaint on file.   HPI Casey Santana presents to the office today for follow-up of MDD, ADHD, and GAD.  Describes mood today as "better". Mood symptoms - reports decreased anxiety, irritability and depression. Continues to have issues with motivation - "especially with things I don't want to do".  Feels like the addition of Abilify has been helpful. Getting out of bed and staying out of bed. Getting things done around the house. Seeing therapist Rockne Menghini. Improved interest and motivation. Taking medications as prescribed. Energy levels stable. Active, does not have a regular exercise routine.  Enjoys some usual interests and activities. Single. Has a boyfriend - 9 months. Lives at home with parents and 2 dogs - "Cotter and Perlie Gold". Has a 87 year old brother - [place]. Spending time with family.  Appetite adequate. Weight stable - 126 pounds. Sleeps well most nights. Averages 7 to 8 hours. Focus and concentration stable. Completing tasks. Managing aspects of household. Work going well - works 25 to 30 hours a week at [employer]. Taking 2 class currently. Denies self harm since September 2021. Denies SI or HI.  Denies AH or VH. Denies substance use, over spending, or reckless behaviors.   Previous medication trials: Lexapro, Prozac, Propanolol, Cymbalta, Wellbutrin XL    GAD-7    Flowsheet Row Office Visit from 02/13/2019 in [facility] from 02/24/2018 in [facility]  Total GAD-7 Score 14 9      PHQ2-9    Flowsheet Row Office Visit from 06/19/2020 in [facility] from 05/12/2019 in [facility] from 04/27/2019 in [facility] from 02/13/2019 in  [facility] from 02/24/2018 in [facility]  PHQ-2 Total Score 0 4 4 2 3   PHQ-9 Total Score -- 19 20 19 11       Flowsheet Row ED from 07/11/2020 in [facility] Office Visit from 02/13/2019 in [facility]  C-SSRS RISK CATEGORY No Risk Error: Question 6 not populated        Review of Systems:  Review of Systems  Musculoskeletal:  Negative for gait problem.  Neurological:  Negative for tremors.  Psychiatric/Behavioral:         Please refer to HPI   Medications: I have reviewed the patient's current medications.  Current Outpatient Medications  Medication Sig Dispense Refill   amphetamine-dextroamphetamine (ADDERALL) 30 MG tablet Take 1 tablet by mouth daily. 30 tablet 0   amphetamine-dextroamphetamine (ADDERALL) 30 MG tablet Take 1 tablet by mouth daily. (Patient not taking: Reported on 06/19/2020) 30 tablet 0   amphetamine-dextroamphetamine (ADDERALL) 30 MG tablet Take 1 tablet by mouth daily. 30 tablet 0   ARIPiprazole (ABILIFY) 2 MG tablet Take 1 tablet (2 mg total) by mouth daily. 30 tablet 2   EPINEPHrine (EPIPEN 2-PAK) 0.3 mg/0.3 mL IJ SOAJ injection Inject 0.3 mg into the muscle as needed for anaphylaxis. 1 each 2   propranolol (INDERAL) 10 MG tablet Take 1 tablet (10 mg total) by mouth 2 (two) times daily. 60 tablet 5   No current facility-administered medications for this visit.    Medication Side Effects: None  Allergies:  Allergies  Allergen Reactions   Peanut-Containing Drug Products  Past Medical History:  Diagnosis Date   Anxiety    Depression    Dysmenorrhea    Heart murmur     Past Medical History, Surgical history, Social history, and Family history were reviewed and updated as appropriate.   Please see review of systems for further details on the patient's review from today.   Objective:   Physical Exam:  There were no  vitals taken for this visit.  Physical Exam Constitutional:      General: Casey E Biskup "MICAH" is not in acute distress. Musculoskeletal:        General: No deformity.  Neurological:     Mental Status: Casey E Cahoon "MICAH" is alert and oriented to person, place, and time.     Coordination: Coordination normal.  Psychiatric:        Attention and Perception: Attention and perception normal. Casey E Fiebelkorn "MICAH" does not perceive auditory or visual hallucinations.        Mood and Affect: Mood normal. Mood is not anxious or depressed. Affect is not labile, blunt, angry or inappropriate.        Speech: Speech normal.        Behavior: Behavior normal.        Thought Content: Thought content normal. Thought content is not paranoid or delusional. Thought content does not include homicidal or suicidal ideation. Thought content does not include homicidal or suicidal plan.        Cognition and Memory: Cognition and memory normal.        Judgment: Judgment normal.     Comments: Insight intact    Lab Review:     Component Value Date/Time   NA 139 07/11/2020 1738   K 3.4 (L) 07/11/2020 1738   CL 106 07/11/2020 1738   CO2 25 07/11/2020 1738   GLUCOSE 100 (H) 07/11/2020 1738   BUN 8 07/11/2020 1738   CREATININE 0.68 07/11/2020 1738   CALCIUM 9.9 07/11/2020 1738   GFRNONAA >60 07/11/2020 1738       Component Value Date/Time   WBC 5.4 07/11/2020 1738   RBC 4.24 07/11/2020 1738   HGB 13.0 07/11/2020 1738   HCT 37.8 07/11/2020 1738   PLT 207 07/11/2020 1738   MCV 89.2 07/11/2020 1738   MCH 30.7 07/11/2020 1738   MCHC 34.4 07/11/2020 1738   RDW 12.0 07/11/2020 1738   LYMPHSABS 2.2 05/23/2020 0828   MONOABS 0.4 05/23/2020 0828   EOSABS 0.2 05/23/2020 0828   BASOSABS 0.1 05/23/2020 0828    No results found for: POCLITH, LITHIUM   No results found for: PHENYTOIN, PHENOBARB, VALPROATE, CBMZ   .res Assessment: Plan:    Plan:  PDMP reviewed  1. Adderall 30mg  daily. 2.  Propanolol 10mg  BID for anxiety 3. Abilify 2mg  daily  111/66/85  40/58 Attention Deficit Disorder likely  Meets DSM 5 criteria for ADHD  RTC 4 weeks  Continue therapy with  Patient advised to contact office with any questions, adverse effects, or acute worsening in signs and symptoms.   There are no diagnoses linked to this encounter.   Please see After Visit Summary for patient specific instructions.  Future Appointments  Date Time Provider Department Center  10/30/2020  1:00 PM [birth year]1983, LCSW [facility] None  11/13/2020  1:00 PM 11/01/2020, LCSW [facility] None  11/27/2020  1:00 PM 11/15/2020, LCSW [facility] None  12/11/2020  1:00 PM 11/29/2020, LCSW [facility] None  12/18/2020  1:00 PM 12/13/2020, LCSW [facility] None  05/26/2021  2:00 PM Willow Ora, MD [facility]    No orders of the defined types were placed in this encounter.   -------------------------------

## 2020-10-30 ENCOUNTER — Other Ambulatory Visit: Payer: Self-pay

## 2020-10-30 ENCOUNTER — Ambulatory Visit: Payer: BC Managed Care – PPO | Admitting: Psychiatry

## 2020-10-30 DIAGNOSIS — F411 Generalized anxiety disorder: Secondary | ICD-10-CM

## 2020-10-30 NOTE — Progress Notes (Signed)
[facility] Counselor/Therapist Progress Note  Patient ID: Casey Santana, MRN: [id number],    Date: 10/30/2020  Time Spent: 58 minutes   Treatment Type: Individual Therapy  Reported Symptoms: "high anxiety", depression, unmotivated  Mental Status Exam:  Appearance:   Casual     Behavior:  Appropriate and Sharing  Motor:  Normal  Speech/Language:   Clear and Coherent  Affect:  Depressed and anxious  Mood:  anxious and depressed  Thought process:  goal directed  Thought content:    obsessiveness  Sensory/Perceptual disturbances:    WNL  Orientation:  oriented to person, place, time/date, situation, day of week, month of year, year, and stated date of Aug. 17, 2022  Attention:  Fair  Concentration:  Fair  Memory:  WNL  Fund of knowledge:   Good  Insight:    Good  Judgment:   Good  Impulse Control:  Fair   Risk Assessment: Danger to Self:  No Self-injurious Behavior: No Danger to Others: No Duty to Warn:no Physical Aggression / Violence:No  Access to Firearms a concern: No  Gang Involvement:No   Subjective:  Today reporting "high anxiety, depression, and unmotivated" related to personal, relationship with BF, family, work, and school.  School especially is a challenge for her as she does not like school (with no particular reasons ever given) but states she is working on trying to change her attitude towards school as she is trying to pursue a college degree to get into the profession of her choice.  Difficulty being close in relationship and needing to talk more about her relationship with BF as there have been some issues more recently on the part of BF not feeling clear about their relationship.  Patient discussed this in more length and acknowledge that she is not a real open person and even with him, there are a lot of things that they have not discussed.  States that BF is actually raising questions with her about their relationship and she seems to feel "backed in  a corner".  Was more tearful, the more she spoke.  Explained some personality issues that she is very sensitive about and actually shared some writing that helped me understand as well.  (Not all details included in this note due to patient privacy needs).  Patient processed more this the remainder of session and we agreed that we would pick back up on it next session.  Was calm and grounded upon leaving and may use journaling as a tool between sessions which I encouraged.  Interventions: Cognitive Behavioral Therapy and Solution-Oriented/Positive Psychology  Diagnosis:   ICD-10-CM   1. Generalized anxiety disorder  F41.1       Treatment goal plan:  Patient not signing tx plan on computer screen due to Covid.  Treatment Goals: Goal remain on tx plan as patient works with strategies to achieve her goals.  Progress is noted each visit in "Progress" section of Plan. Long term goal: Develop healthy cognitive patterns and beliefs about self and the world that help alleviate depression/anxiety and any thoughts to harm self, and also help prevent relapse of depression. Short term goal: Identify and replace depressive thinking that leads to depressive/anxious feelings and actions. Strategy: Identify depressive/anxious/negative thoughts and self-talk that lead to depression/anxiety and work interrupt those cognitions and replace with more reality-based, positive, and empowering thoughts and self-talk.   Plan:  Patient today came saying she was not very motivated but experiencing high anxiety and depression.  Denies any  SI.  Had a difficult time communicating initially but became much more open and sharing during session.  A lot of issues that she is dealing with right now have to do with her relationship with BF, which also tends to affect other areas of her life.  Worked hard in expressing herself as openly as she could, and agrees to follow up on this next session as we ran out of time today.   Encouraged patient to practice behaviors that have been helpful to her in the past including getting outside daily and walking, consistent positive self talk and self-care, be in touch with at least 2 friends per day, refrain from any self cutting with which she is doing really well, remain in the present focusing on what she can control, interrupt anxious/negative thoughts and replace with more realistic and empowering thoughts that do not feed her anxiety nor depression, intentionally look for positives every day, continue work on her self esteem and self confidence believing in herself that she can make good decisions and make progress on her goals, look for the positives within herself, stay in contact with supportive people, and feel good about the strength that she is showing in the midst of challenging circumstances as she works with goal-directed behaviors to move forward in a more positive direction of improved emotional health and stability.  Goal review and progress/challenges noted with patient.  Next appointment within 2 to 3 weeks.   Mathis Fare, LCSW

## 2020-11-11 ENCOUNTER — Other Ambulatory Visit: Payer: Self-pay

## 2020-11-11 ENCOUNTER — Telehealth: Payer: Self-pay | Admitting: Adult Health

## 2020-11-11 DIAGNOSIS — F909 Attention-deficit hyperactivity disorder, unspecified type: Secondary | ICD-10-CM

## 2020-11-11 NOTE — Telephone Encounter (Signed)
Pended.

## 2020-11-11 NOTE — Telephone Encounter (Signed)
Pt called in for refill on Adderall 30mg . Appt 11/8. Ph [phone]. Pharmacy 13/8 [address] [place]

## 2020-11-12 MED ORDER — AMPHETAMINE-DEXTROAMPHETAMINE 30 MG PO TABS: 30.0000 mg | ORAL_TABLET | Freq: Every day | ORAL | 0 refills | Status: DC

## 2020-11-13 ENCOUNTER — Ambulatory Visit (INDEPENDENT_AMBULATORY_CARE_PROVIDER_SITE_OTHER): Payer: BC Managed Care – PPO | Admitting: Psychiatry

## 2020-11-13 DIAGNOSIS — F411 Generalized anxiety disorder: Secondary | ICD-10-CM | POA: Diagnosis not present

## 2020-11-13 NOTE — Progress Notes (Signed)
[facility] Counselor/Therapist Progress Note  Patient ID: Casey Santana, MRN: [id number],    Date: 11/13/2020  Time Spent: 55 minutes   Virtual Visit via MyChart Note:  VIDEO only Connected with patient by a video enabled telemedicine/telehealth application or telephone, with their informed consent, and verified patient privacy and that I am speaking with the correct person using two identifiers. I discussed the limitations, risks, security and privacy concerns of performing psychotherapy and management service by telephone and the availability of in person appointments. I also discussed with the patient that there may be a patient responsible charge related to this service. The patient expressed understanding and agreed to proceed. I discussed the treatment planning with the patient. The patient was provided an opportunity to ask questions and all were answered. The patient agreed with the plan and demonstrated an understanding of the instructions. The patient was advised to call  our office if  symptoms worsen or feel they are in a crisis state and need immediate contact.   Therapist Location: [facility] Patient Location: home   Treatment Type: Individual Therapy  Reported Symptoms: anxiety, depression, stressed with school starting, decreased motivation  Mental Status Exam:  Appearance:   Casual     Behavior:  Appropriate and Sharing  Motor:  Normal  Speech/Language:   Clear and Coherent  Affect:  Depressed and anxiety  Mood:  anxious and depressed  Thought process:  goal directed  Thought content:    Some obsessiveness  Sensory/Perceptual disturbances:    WNL  Orientation:  oriented to person, place, time/date, situation, day of week, month of year, year, and stated date of Aug. 31, 2022  Attention:  Fair  Concentration:  Fair  Memory:  WNL  Fund of knowledge:   Good  Insight:    Good  Judgment:   Good  Impulse Control:  Good   Risk Assessment: Danger  to Self:  No Self-injurious Behavior: No Danger to Others: No Duty to Warn:no Physical Aggression / Violence:No  Access to Firearms a concern: No  Gang Involvement:No   Subjective: Patient in today reporting anxiety and depression related to "personal, family, wants more friends but I also don't do too well with friends, school is a stressor." She reports 11 months and 3 days with no episodes of cutting herself.  Patient able to process some of her feelings about the progress she has made over time and not cutting herself and in beginning to feel some better about herself.  Due to her having a COVID exposure, we had to do her session via MyChart video it was interesting to see some of the progress that she has made in feeling some better about herself and with some improvement in eye contact also.  She also brought up the subject about making new friends and seemed to have a lot of apprehension about conflict.  She had also done some journaling between sessions and shared that at this time.  Seemed to have the feeling that conflict would mean the end of relationship with someone versus conflict being something that is natural and sometimes happens and can be worked through and not necessarily result in a relationship ending.  Overall, less anxiety and less depression than last session.  Has enrolled at a community college and is taking a couple courses, still thinking she may have interest in becoming a school Doctor, general practice.  Encouraged her to reach out to a local speech pathologist within the school system to see  if they would talk with her some about the field and their experiences, which seemed to really interest patient.  Issues with BF have improved.  No tearfulness today and seemed more self-assured in her talking.  Discussed mutually some growth and progress patient is making.   Interventions: Cognitive Behavioral Therapy, Ego-Supportive, and Insight-Oriented  Diagnosis:   ICD-10-CM   1.  Generalized anxiety disorder  F41.1         Treatment goal plan:  Patient not signing tx plan on computer screen due to Covid.  Treatment Goals: Goal remain on tx plan as patient works with strategies to achieve her goals.  Progress is noted each visit in "Progress" section of Plan. Long term goal: Develop healthy cognitive patterns and beliefs about self and the world that help alleviate depression/anxiety and any thoughts to harm self, and also help prevent relapse of depression. Short term goal: Identify and replace depressive thinking that leads to depressive/anxious feelings and actions. Strategy: Identify depressive/anxious/negative thoughts and self-talk that lead to depression/anxiety and work interrupt those cognitions and replace with more reality-based, positive, and empowering thoughts and self-talk.   Plan: Patient today showing less motivation initially but she became much more motivated and verbal as the session progressed.  She particularly worked with some of her assumptions about conflict in relationships, her own difficulty with conflict, some of her personal and family stressors, the desire to have more friends but that also feels threatening, and school related issues.  Showing some growth in her confidence level and herself overall as noted above.  Encouraged patient to work with behaviors between sessions that have been helpful in the past, including looking for the positives within herself, staying in contact with supportive people, consistent positive self talk and self-care, getting outside daily and walking, being in touch with at least 2 friends per day, refrain from any self cutting with which she is doing really well, remain in the present focusing on what she can control, interrupt anxious/negative thoughts and challenge them and replace with more realistic and empowering thoughts that do not feed her anxiety nor depression, look for more positives than negatives every  day, continue to work on her self esteem and self confidence believing in herself that she can make good decisions and make progress on her goals which she is actually showing evidence of, and recognize the strength that she is showing in the midst of challenging circumstances as she works with goal-directed behaviors to move forward in a more positive direction of improved emotional health and stability.  Goal review and progress/challenges noted with patient.  Next appointment within 2 to 3 weeks.   Mathis Fare, LCSW

## 2020-11-27 ENCOUNTER — Ambulatory Visit: Payer: BC Managed Care – PPO | Admitting: Psychiatry

## 2020-11-27 ENCOUNTER — Other Ambulatory Visit: Payer: Self-pay

## 2020-11-27 DIAGNOSIS — F411 Generalized anxiety disorder: Secondary | ICD-10-CM | POA: Diagnosis not present

## 2020-11-27 NOTE — Progress Notes (Signed)
[facility] Counselor/Therapist Progress Note  Patient ID: Casey Santana, MRN: [id number],    Date: 11/27/2020  Time Spent: 50   Treatment Type: Individual Therapy  Reported Symptoms: anxiety, lack of motivation, work frustrations, depression  Mental Status Exam:  Appearance:   Casual     Behavior:  Appropriate and Sharing  Motor:  Normal  Speech/Language:   Clear and Coherent  Affect:  Depressed and anxious  Mood:  anxious and depressed  Thought process:  goal directed  Thought content:    WNL  Sensory/Perceptual disturbances:    WNL  Orientation:  oriented to person, place, time/date, situation, day of week, month of year, year, and stated date of Sept 14, 2022  Attention:  Fair  Concentration:  Good and Fair  Memory:  WNL  Fund of knowledge:   Good  Insight:    Fair  Judgment:   Good  Impulse Control:  Good and Fair   Risk Assessment: Danger to Self:  No Self-injurious Behavior: No Danger to Others: No Duty to Warn:no Physical Aggression / Violence:No  Access to Firearms a concern: No  Gang Involvement:No   Subjective: Patient in today reporting anxiety, depression, work frustrations, and lack of motivation especially with school "but I know I've got to do school." Taking [place] and [place]. Doesn't like school and doesn't want to have to be in it but knows it's necessary for her work future and she's feeling stressed and decisive at times.  First worked with helping her de-stress some and then talk through more of her indecision about goals and schooling and also looking at how this leads her to feel more anxious and less motivated.  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  Talked about this more today and she is to follow through this time and try to inquire about the opportunity to speak with someone who is already  practicing in that field.  I think her lack of follow-through earlier is due more to her uncertainty and her current anxiety.  It does seem after she talks about the issues that create anxiety for her, her anxiety decreases as I have seen this happen in sessions.  Shared this with her which seemed to be affirming.  She is very focused on this whole issue of school and what she wants to do in life work, which is understandable at her age and certainly appropriate. Less emphasis on any conflict this session as was discussed last session.  Relationship with BF remains improved.  Less tearfulness today and seems more focused and committed to her schooling.   Interventions: Solution-Oriented/Positive Psychology, Ego-Supportive, and Insight-Oriented  Diagnosis:   ICD-10-CM   1. Generalized anxiety disorder  F41.1       Treatment goal plan:  Patient not signing tx plan on computer screen due to Covid.  Treatment Goals: Goal remain on tx plan as patient works with strategies to achieve her goals.  Progress is noted each visit in "Progress" section of Plan. Long term goal: Develop healthy cognitive patterns and beliefs about self and the world that help alleviate depression/anxiety and any thoughts to harm self, and also help prevent relapse of depression. Short term goal: Identify and replace depressive thinking that leads to depressive/anxious feelings and actions. Strategy: Identify depressive/anxious/negative thoughts and self-talk that lead to depression/anxiety and work interrupt those  cognitions and replace with more reality-based, positive, and empowering thoughts and self-talk.    Plan: Patient today showing some motivation and participation in session.  She actually spoke openly and was less tearful today than on some occasions.  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  Today her focus was more on her being in school and issues  that go along with that, as noted above.  (Not all details included in this note due to patient privacy needs.)  Encouraged patient to practice positive behaviors that have been helpful to her in the past including: Staying in contact with supportive people, finding the positives within herself, consistent positive self talk and self-care, being in touch with at least 2 friends per day, refrain from any self cutting with which she is doing really well, getting outside daily and walking, remaining in the present focusing on what she can control, interrupt anxious/negative thoughts and challenge them and replace with more realistic and empowering thoughts that do not feed anxiety nor depression, looking for more positives and negatives every day, continuing to work on elevating her self-esteem and self-confidence, believe in herself that she can make good decisions and make progress on her goals which she is actually showing evidence of already, saying no when she needs to say no, practicing healthy boundaries with others, and recognize the strength she is showing working with goal-directed behaviors to move forward in a more positive direction of improved emotional health.  Goal review and progress/challenges noted with patient.  Next appointment within 2 to 3 weeks.   Mathis Fare, LCSW

## 2020-12-11 ENCOUNTER — Ambulatory Visit: Payer: BC Managed Care – PPO | Admitting: Psychiatry

## 2020-12-16 ENCOUNTER — Encounter (HOSPITAL_BASED_OUTPATIENT_CLINIC_OR_DEPARTMENT_OTHER): Payer: Self-pay | Admitting: Obstetrics & Gynecology

## 2020-12-16 ENCOUNTER — Telehealth: Payer: Self-pay | Admitting: Adult Health

## 2020-12-16 ENCOUNTER — Telehealth (HOSPITAL_BASED_OUTPATIENT_CLINIC_OR_DEPARTMENT_OTHER): Payer: Self-pay | Admitting: Obstetrics & Gynecology

## 2020-12-16 ENCOUNTER — Other Ambulatory Visit: Payer: Self-pay

## 2020-12-16 ENCOUNTER — Ambulatory Visit (HOSPITAL_BASED_OUTPATIENT_CLINIC_OR_DEPARTMENT_OTHER): Payer: BC Managed Care – PPO | Admitting: Obstetrics & Gynecology

## 2020-12-16 VITALS — BP 124/72 | HR 82 | Ht 64.0 in | Wt 126.4 lb

## 2020-12-16 DIAGNOSIS — N643 Galactorrhea not associated with childbirth: Secondary | ICD-10-CM

## 2020-12-16 DIAGNOSIS — N6321 Unspecified lump in the left breast, upper outer quadrant: Secondary | ICD-10-CM

## 2020-12-16 MED ORDER — DOXYCYCLINE HYCLATE 100 MG PO CAPS: 100.0000 mg | ORAL_CAPSULE | Freq: Two times a day (BID) | ORAL | 0 refills | Status: DC

## 2020-12-16 NOTE — Progress Notes (Signed)
GYNECOLOGY  VISIT  CC:   left breast mass  HPI: 20 y.o. G0 Single White or Caucasian nonbinary here for complaint of left breast pain and irregular mass that has been present about 1-2 months.  Can has nipple discharge (white or clear) if squeeze nipples.  Denies blood from nipple.  Has a nexplanon but has very irregular cycles with this.  Not sure she likes this but is ok for now.  Has Nexplanon placed 07/2020.  Cycles are irregular since placement.  She is having a lot of spotting with this.  Cannot tell if there are any breast changes with her cycles because of the irregular bleeding.    GYNECOLOGIC HISTORY: No LMP recorded. Patient has had an implant. 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 30 tablet 0   amphetamine-dextroamphetamine (ADDERALL) 30 MG tablet Take 1 tablet by mouth daily. 30 tablet 0   amphetamine-dextroamphetamine (ADDERALL) 30 MG tablet Take 1 tablet by mouth daily. 30 tablet 0   ARIPiprazole (ABILIFY) 2 MG tablet Take 1 tablet (2 mg total) by mouth daily. 30 tablet 2   EPINEPHrine (EPIPEN 2-PAK) 0.3 mg/0.3 mL IJ SOAJ injection Inject 0.3 mg into the muscle as needed for anaphylaxis. 1 each 2   propranolol (INDERAL) 10 MG tablet Take 1 tablet (10 mg total) by mouth 2 (two)  times daily. 60 tablet 5   No current facility-administered medications on file prior to visit.    ALLERGIES: Peanut-containing drug products  Family History  Problem Relation Age of Onset   Arthritis Maternal Grandmother    Hearing loss Maternal Grandmother    Hyperlipidemia Maternal Grandmother    Hypertension Maternal Grandmother    Anxiety disorder Maternal Grandmother    Arthritis Maternal Grandfather    Cancer Maternal Grandfather    Depression Maternal Grandfather    Hearing loss Maternal Grandfather    Hyperlipidemia Maternal Grandfather    Hyperlipidemia Paternal Grandmother    Hyperlipidemia Paternal Grandfather    Mental retardation Mother        bipolar disorder   Arthritis Mother    Hyperlipidemia Father    Bipolar disorder Other    Schizophrenia Other    Migraines Neg Hx    Seizures Neg Hx    Autism Neg Hx    ADD / ADHD Neg Hx     SH:  single, non smoker  Review of Systems  Constitutional: Negative.   Endocrine: Negative.   Genitourinary:  Positive for menstrual problem.   PHYSICAL EXAMINATION:    BP 124/72 (BP Location: Right Arm, Patient Position: Sitting, Cuff Size: Normal)   Pulse 82   Ht 5\' 4"  (1.626 m) Comment: reported  Wt 126 lb 6.4 oz (57.3 kg)   BMI 21.70 kg/m     Physical Exam Constitutional:  Appearance: Normal appearance.  Cardiovascular:     Rate and Rhythm: Normal rate and regular rhythm.     Pulses: Normal pulses.     Heart sounds: Normal heart sounds.  Chest:  Breasts:    Right: Normal. No bleeding, inverted nipple, mass, nipple discharge, skin change or tenderness.     Left: Mass and tenderness present. No bleeding, inverted nipple, nipple discharge or skin change.    Musculoskeletal:     Cervical back: Normal range of motion and neck supple. Tenderness present. No rigidity.  Lymphadenopathy:     Upper Body:     Right upper body: No supraclavicular or axillary adenopathy.     Left upper body: No supraclavicular or  axillary adenopathy.  Neurological:     General: No focal deficit present.     Mental Status: Casey Santana "MICAH" is alert.  Psychiatric:        Mood and Affect: Mood normal.   Chaperone, Ina Homes, CMA, was present for exam.  Assessment/Plan: 1. Mass of upper outer quadrant of left breast - suspect fibrocystic changes with associated nipple dishcarge but feel u/s appropriate given pain and also to r/o breast fibroma - US BREAST LTD UNI LEFT INC AXILLA; Future  2. Galactorrhea - Prolactin

## 2020-12-16 NOTE — Telephone Encounter (Signed)
Patient called stated she felt a lump in her breast  and what should see do Called patient.  I explain to patient I will give the nurse Selena Batten the message and have her call her.

## 2020-12-16 NOTE — Telephone Encounter (Signed)
Pt states that she feels a lump in her left breast that is concerning to her. She says there is some pain associated when she presses on it. She describes it as being on the upper portion of her breast. Pt given appt for evaluation.

## 2020-12-16 NOTE — Telephone Encounter (Signed)
Pt called to say that [facility] not have Adderall 30 mg in stock. She was told to call other pharmacies and let us know who has it and we can send it in.

## 2020-12-17 DIAGNOSIS — N6321 Unspecified lump in the left breast, upper outer quadrant: Secondary | ICD-10-CM | POA: Insufficient documentation

## 2020-12-17 LAB — PROLACTIN: Prolactin: 17.7 ng/mL (ref 4.8–23.3)

## 2020-12-18 ENCOUNTER — Telehealth: Payer: Self-pay | Admitting: Adult Health

## 2020-12-18 ENCOUNTER — Other Ambulatory Visit: Payer: Self-pay

## 2020-12-18 ENCOUNTER — Ambulatory Visit (INDEPENDENT_AMBULATORY_CARE_PROVIDER_SITE_OTHER): Payer: BC Managed Care – PPO | Admitting: Psychiatry

## 2020-12-18 DIAGNOSIS — F411 Generalized anxiety disorder: Secondary | ICD-10-CM | POA: Diagnosis not present

## 2020-12-18 DIAGNOSIS — F909 Attention-deficit hyperactivity disorder, unspecified type: Secondary | ICD-10-CM

## 2020-12-18 MED ORDER — AMPHETAMINE-DEXTROAMPHETAMINE 30 MG PO TABS: 30.0000 mg | ORAL_TABLET | Freq: Every day | ORAL | 0 refills | Status: DC

## 2020-12-18 NOTE — Telephone Encounter (Signed)
Pended.

## 2020-12-18 NOTE — Progress Notes (Signed)
[facility] Counselor/Therapist Progress Note  Patient ID: MINDA FAAS, MRN: [id number],    Date: 12/18/2020  Time Spent: 52 minutes   Virtual Visit via myChart Video Note:   VIDEO session Connected with patient by a telemedicine/telehealth application, with their informed consent, and verified patient privacy and that I am speaking with the correct person using two identifiers. I discussed the limitations, risks, security and privacy concerns of performing psychotherapy and the availability of in person appointments. I also discussed with the patient that there may be a patient responsible charge related to this service. The patient expressed understanding and agreed to proceed. I discussed the treatment planning with the patient. The patient was provided an opportunity to ask questions and all were answered. The patient agreed with the plan and demonstrated an understanding of the instructions. The patient was advised to call  our office if  symptoms worsen or feel they are in a crisis state and need immediate contact.   Therapist Location: [facility] Patient Location: home   Treatment Type: Individual Therapy  Reported Symptoms: anxiety, depression, decreased motivation  Mental Status Exam:  Appearance:   Casual     Behavior:  Appropriate and Sharing  Motor:  Normal  Speech/Language:   Clear and Coherent  Affect:  Depressed and anxiety  Mood:  anxious and depressed  Thought process:  goal directed  Thought content:    overthinking  Sensory/Perceptual disturbances:    WNL  Orientation:  oriented to person, place, time/date, situation, day of week, month of year, year, and stated date of Oct. 5, 2022  Attention:  Fair  Concentration:  Fair  Memory:  WNL  Fund of knowledge:   Good  Insight:    Fair  Judgment:   Fair  Impulse Control:  Fair   Risk Assessment: Danger to Self:  No Self-injurious Behavior: No Danger to Others: No Duty to Warn:no Physical  Aggression / Violence:No  Access to Firearms a concern: No  Gang Involvement:No   Subjective:  Patient in today reporting anxiety as main symptom and also depression. Decreased motivation.  Reports no further self-cutting and feels good about that. School continues to be a challenge which we discussed further today.  Also wanted to focus on some relationship concerns within her family and outside of family. Sees some hurtful/ineffective communication at home and has noticed it and found herself being more mindful and treating others in family in less hurtful ways. Processed how this leads her to feel more depressed and "sad".  What helps her feel better is when she responds differently to the person who may feel hurt.  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   Interventions: Solution-Oriented/Positive Psychology, Ego-Supportive, and Insight-Oriented  Diagnosis:   ICD-10-CM   1. Generalized anxiety disorder  F41.1       Treatment goal plan:  Patient not signing tx plan on computer screen due to Covid.  Treatment Goals: Goal remain on tx plan as patient works with strategies to achieve her goals.  Progress is noted each visit in "Progress" section of Plan. Long term goal: Develop healthy cognitive patterns and beliefs about self and the world that help alleviate depression/anxiety and any thoughts to harm self, and also help prevent relapse of depression. Short term goal: Identify and replace depressive  thinking that leads to depressive/anxious feelings and actions. Strategy: Identify depressive/anxious/negative thoughts and self-talk that lead to depression/anxiety and work interrupt those cognitions and replace with more reality-based, positive, and empowering thoughts and self-talk.    Plan:   Patient today  showing some improved motivation which got better over the course of session.  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  Some anxiety discussed as she shared her concerns that her BF is "big on school" and she wonders if he would stay with her if she chose not to finish school.  Discussed this in more detail as well as what her fears are in connection with BF.  Encouraged patient in practicing positive behaviors that have been helpful to her in the past including: Finding the positives within herself, being in touch with at least 2 friends per day, consistent positive self talk, good self-care, staying in contact with supportive people, refrain from any self cutting which she is doing really well with at this point, getting outside daily and walking, remaining in the present focusing on what she can control, interrupting anxious/negative thoughts and challenging them and replacing with more realistic and empowering thoughts that do not feed anxiety nor depression, looking for more positives than negatives each day, continuing to work on elevating her self-esteem and self-confidence, believing in herself that she can make good decisions and make progress on her goals which she is actually showing evidence of already, practicing healthy boundaries with others, saying no when she needs to say no, and feel good about the strength she shows as she works with goal-directed behaviors to move in a direction that supports improved emotional health and wellbeing.  Goal review and progress/challenges noted with patient.  Next appointment within 2 to 3 weeks.  This record has been created using [employer].  Chart creation errors have been sought, but may not always have been located and corrected.  Such creation errors do not reflect on the standard  of medical care provided.    Mathis Fare, LCSW

## 2020-12-18 NOTE — Telephone Encounter (Signed)
To: [facility] [address] has the Adderall. Can you please send her regular Adderall to this pharmacy? She called Monday and gave [place] a pharmacy but I don't see a note re which one to send it in to.

## 2020-12-27 ENCOUNTER — Telehealth: Payer: Self-pay | Admitting: Adult Health

## 2020-12-27 ENCOUNTER — Other Ambulatory Visit: Payer: Self-pay

## 2020-12-27 DIAGNOSIS — F331 Major depressive disorder, recurrent, moderate: Secondary | ICD-10-CM

## 2020-12-27 DIAGNOSIS — F411 Generalized anxiety disorder: Secondary | ICD-10-CM

## 2020-12-27 MED ORDER — ARIPIPRAZOLE 2 MG PO TABS: 2.0000 mg | ORAL_TABLET | Freq: Every day | ORAL | 0 refills | Status: DC

## 2020-12-27 NOTE — Telephone Encounter (Signed)
Rx sent 

## 2020-12-27 NOTE — Telephone Encounter (Signed)
Pt requesting Rx for generic Abilify 2 mg. New Pharmacy to [facility]. On file. Apt 11/8

## 2021-01-01 ENCOUNTER — Ambulatory Visit: Payer: BC Managed Care – PPO | Admitting: Psychiatry

## 2021-01-01 ENCOUNTER — Other Ambulatory Visit: Payer: Self-pay

## 2021-01-01 ENCOUNTER — Telehealth: Payer: Self-pay | Admitting: Adult Health

## 2021-01-01 DIAGNOSIS — F411 Generalized anxiety disorder: Secondary | ICD-10-CM

## 2021-01-01 DIAGNOSIS — F331 Major depressive disorder, recurrent, moderate: Secondary | ICD-10-CM

## 2021-01-01 MED ORDER — ARIPIPRAZOLE 2 MG PO TABS: 2.0000 mg | ORAL_TABLET | Freq: Every day | ORAL | 0 refills | Status: DC

## 2021-01-01 NOTE — Telephone Encounter (Signed)
Rx sent 

## 2021-01-01 NOTE — Telephone Encounter (Signed)
Next appt 1/18

## 2021-01-01 NOTE — Telephone Encounter (Signed)
Pt's mom called about refill on Abilify.  Script is not available.  Looks like per the "med" tab, the script shows as 'sent to the pharmacy' and not 'received at the pharmacy'.  Pls try to resend it. [facility] [place] [facility].

## 2021-01-07 ENCOUNTER — Ambulatory Visit
Admission: RE | Admit: 2021-01-07 | Discharge: 2021-01-07 | Disposition: A | Discharge status: Home / Self Care | Payer: BC Managed Care – PPO | Source: Ambulatory Visit | Attending: Obstetrics & Gynecology | Admitting: Obstetrics & Gynecology

## 2021-01-07 ENCOUNTER — Other Ambulatory Visit: Payer: Self-pay

## 2021-01-07 DIAGNOSIS — N6321 Unspecified lump in the left breast, upper outer quadrant: Secondary | ICD-10-CM

## 2021-01-15 ENCOUNTER — Other Ambulatory Visit: Payer: Self-pay

## 2021-01-15 ENCOUNTER — Ambulatory Visit: Payer: BC Managed Care – PPO | Admitting: Psychiatry

## 2021-01-15 ENCOUNTER — Other Ambulatory Visit: Payer: Self-pay | Admitting: Adult Health

## 2021-01-15 DIAGNOSIS — F909 Attention-deficit hyperactivity disorder, unspecified type: Secondary | ICD-10-CM

## 2021-01-15 DIAGNOSIS — F411 Generalized anxiety disorder: Secondary | ICD-10-CM | POA: Diagnosis not present

## 2021-01-15 MED ORDER — AMPHETAMINE-DEXTROAMPHETAMINE 30 MG PO TABS: 30.0000 mg | ORAL_TABLET | Freq: Every day | ORAL | 0 refills | Status: DC

## 2021-01-15 NOTE — Progress Notes (Signed)
[facility] Counselor/Therapist Progress Note  Patient ID: Casey Santana, MRN: [id number],    Date: 01/15/2021  Time Spent:  50 minutes  Treatment Type: Individual Therapy  Reported Symptoms: anxiety, depression  Mental Status Exam:  Appearance:   Neat     Behavior:  Appropriate, Sharing, and Motivated Lack of motivation for school  Motor:  Normal  Speech/Language:   Clear and Coherent  Affect:  Depressed and anxiety  Mood:  anxious and depressed  Thought process:  normal  Thought content:    Obsessions  Sensory/Perceptual disturbances:    WNL  Orientation:  oriented to person, place, time/date, situation, day of week, month of year, year, and stated date of Nov. 2, 2022  Attention:  Fair  Concentration:  Fair  Memory:  WNL  Fund of knowledge:   Good  Insight:    Good and Fair  Judgment:   Good  Impulse Control:  Good   Risk Assessment: Danger to Self:  No Self-injurious Behavior: No Danger to Others: No Duty to Warn:no Physical Aggression / Violence:No  Access to Firearms a concern: No  Gang Involvement:No   Subjective: Patient in today reporting anxiety and depression. Denies SI and has resisted any urges to self-cut and she reports today is "Day 400" of not cutting herself. Followed up on her homework and self-negating. Often feeling like I'm failing even though people give me praise, "and I tend to overthink a lot of things. Aware today of her negative outlook on things and this is reflecting her overall view, making it more negative. Worked on changing thoughts using specific examples , especially ones that she had struggles with, including anxious/negative/stressful thoughts that hold her back especially in getting out more around others her age and in pursuing things she may be interested in and having more opportunity to develop friendships.  To follow up more on her sad feelings regarding family communication next session.  Patient is to do some written  homework regarding this before next session.  Interventions: Solution-Oriented/Positive Psychology and Insight-Oriented  Diagnosis:   ICD-10-CM   1. Generalized anxiety disorder  F41.1      Treatment goal plan:  Patient not signing tx plan on computer screen due to Covid.  Treatment Goals: Goal remain on tx plan as patient works with strategies to achieve her goals.  Progress is noted each visit in "Progress" section of Plan. Long term goal: Develop healthy cognitive patterns and beliefs about self and the world that help alleviate depression/anxiety and any thoughts to harm self, and also help prevent relapse of depression. Short term goal: Identify and replace depressive thinking that leads to depressive/anxious feelings and actions. Strategy: Identify depressive/anxious/negative thoughts and self-talk that lead to depression/anxiety and work interrupt those cognitions and replace with more reality-based, positive, and empowering thoughts and self-talk.    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  Has a written assignment to do before next session related to these issues.  Encouraged patient and practicing positive behaviors that have helped her in the past including: Finding the positives within herself, being in touch with at least 2 friends per week, consistent positive self talk, good self-care, staying in contact with supportive people, refrain from any self cutting which she is doing really  well with at this point, getting outside daily and walking, remaining in the present focusing on what she can control, interrupting anxious/negative thoughts and challenging them to replace with more realistic and empowering thoughts that do not feed her  anxiety nor depression, looking for more positives than negatives each day, continuing to work on elevating her self-esteem and self-confidence, believing in herself more that she can make good decisions and make progress on her goals which she is actually showing evidence of already, practicing healthy boundaries with others, saying no when she needs to say no and realize the strength that she shows as she works with goal-directed behaviors to move in a direction of improved emotional health.  Goal review and progress/challenges noted with patient.  Next appointment within 2 weeks.  This record has been created using [employer].  Chart creation errors have been sought, but may not always have been located and corrected.  Such creation errors do not reflect on the standard of medical care provided.    Mathis Fare, LCSW

## 2021-01-15 NOTE — Telephone Encounter (Signed)
Pt requesting Adderall  30 mg Rx to [facility] on back order. Apt 11/8.

## 2021-01-21 ENCOUNTER — Encounter: Payer: Self-pay | Admitting: Adult Health

## 2021-01-21 ENCOUNTER — Other Ambulatory Visit: Payer: Self-pay

## 2021-01-21 ENCOUNTER — Ambulatory Visit: Payer: BC Managed Care – PPO | Admitting: Adult Health

## 2021-01-21 DIAGNOSIS — F411 Generalized anxiety disorder: Secondary | ICD-10-CM | POA: Diagnosis not present

## 2021-01-21 DIAGNOSIS — F331 Major depressive disorder, recurrent, moderate: Secondary | ICD-10-CM

## 2021-01-21 DIAGNOSIS — F909 Attention-deficit hyperactivity disorder, unspecified type: Secondary | ICD-10-CM

## 2021-01-21 MED ORDER — DESVENLAFAXINE SUCCINATE ER 50 MG PO TB24: 50.0000 mg | ORAL_TABLET | Freq: Every day | ORAL | 5 refills | Status: DC

## 2021-01-21 MED ORDER — PROPRANOLOL HCL 10 MG PO TABS: 10.0000 mg | ORAL_TABLET | Freq: Two times a day (BID) | ORAL | 5 refills | Status: DC

## 2021-01-21 NOTE — Progress Notes (Signed)
DWAYNA KENTNER [id number] [birth year] 20 y.o.  Subjective:   Patient ID:  Casey Santana is a 20 y.o. (DOB April 07, 2000) adult.  Chief Complaint: No chief complaint on file.   HPI Casey Santana presents to the office today for follow-up of MDD, ADHD, and GAD.  Describes mood today as "better". Mood symptoms - reports anxiety, irritability and depression - "still having some issues". Feeling internally restless - possibly Abilify. Lacking interest and motivation. Seeing therapist Rockne Menghini. Has started journaling. Improved interest and motivation. Taking medications as prescribed. Energy levels "could be better". Active, does not have a regular exercise routine.  Enjoys some usual interests and activities. Single. Has a boyfriend -1 year. Lives at home with parents and 2 dogs - "Auburn and Perlie Gold". Has a 30 year old brother - [place]. Spending time with family.  Appetite adequate. Weight stable - 126 pounds. Sleeps well most nights. Averages 7 to 8 hours. Focus and concentration stable. Completing tasks. Managing aspects of household. Work going well - works 25 to 30 hours a week at [employer]. Taking 2 class currently. Denies self harm since September 2021. Denies SI or HI.  Denies AH or VH. Clean 400 days from self harm. Denies substance use, over spending, or reckless behaviors.   Previous medication trials: Lexapro, Prozac, Propanolol, Cymbalta, Wellbutrin XL   GAD-7    Flowsheet Row Office Visit from 02/13/2019 in [facility] from 02/24/2018 in [facility]  Total GAD-7 Score 14 9      PHQ2-9    Flowsheet Row Office Visit from 12/16/2020 in [facility] Office Visit from 06/19/2020 in [facility] from 05/12/2019 in [facility] from 04/27/2019 in [facility] from 02/13/2019 in  [facility]  PHQ-2 Total Score 0 0 4 4 2   PHQ-9 Total Score -- -- 19 20 19       Flowsheet Row ED from 07/11/2020 in [facility] Office Visit from 02/13/2019 in [facility]  C-SSRS RISK CATEGORY No Risk Error: Question 6 not populated        Review of Systems:  Review of Systems  Musculoskeletal:  Negative for gait problem.  Neurological:  Negative for tremors.  Psychiatric/Behavioral:         Please refer to HPI   Medications: I have reviewed the patient's current medications.  Current Outpatient Medications  Medication Sig Dispense Refill   desvenlafaxine (PRISTIQ) 50 MG 24 hr tablet Take 1 tablet (50 mg total) by mouth daily. 30 tablet 5   amphetamine-dextroamphetamine (ADDERALL) 30 MG tablet Take 1 tablet by mouth daily. 30 tablet 0   amphetamine-dextroamphetamine (ADDERALL) 30 MG tablet Take 1 tablet by mouth daily. 30 tablet 0   amphetamine-dextroamphetamine (ADDERALL) 30 MG tablet Take 1 tablet by mouth daily. 30 tablet 0   doxycycline (VIBRAMYCIN) 100 MG capsule Take 1 capsule (100 mg total) by mouth 2 (two) times daily. Take with food as can cause GI distress. 14 capsule 0   EPINEPHrine (EPIPEN 2-PAK) 0.3 mg/0.3 mL IJ SOAJ injection Inject 0.3 mg into the muscle as needed for anaphylaxis. 1 each 2   propranolol (INDERAL) 10 MG tablet Take 1 tablet (10 mg total) by mouth 2 (two) times daily. 60 tablet 5   No current facility-administered medications for this visit.    Medication Side Effects: None  Allergies:  Allergies  Allergen Reactions  Peanut-Containing Drug Products     Past Medical History:  Diagnosis Date   Anxiety    Depression    Dysmenorrhea    Heart murmur     Past Medical History, Surgical history, Social history, and Family history were reviewed and updated as appropriate.   Please see review of systems for further details on the patient's review from today.    Objective:   Physical Exam:  There were no vitals taken for this visit.  Physical Exam Constitutional:      General: She is not in acute distress. Musculoskeletal:        General: No deformity.  Neurological:     Mental Status: She is alert and oriented to person, place, and time.     Coordination: Coordination normal.  Psychiatric:        Attention and Perception: Attention and perception normal. She does not perceive auditory or visual hallucinations.        Mood and Affect: Mood normal. Mood is not anxious or depressed. Affect is not labile, blunt, angry or inappropriate.        Speech: Speech normal.        Behavior: Behavior normal.        Thought Content: Thought content normal. Thought content is not paranoid or delusional. Thought content does not include homicidal or suicidal ideation. Thought content does not include homicidal or suicidal plan.        Cognition and Memory: Cognition and memory normal.        Judgment: Judgment normal.     Comments: Insight intact    Lab Review:     Component Value Date/Time   NA 139 07/11/2020 1738   K 3.4 (L) 07/11/2020 1738   CL 106 07/11/2020 1738   CO2 25 07/11/2020 1738   GLUCOSE 100 (H) 07/11/2020 1738   BUN 8 07/11/2020 1738   CREATININE 0.68 07/11/2020 1738   CALCIUM 9.9 07/11/2020 1738   GFRNONAA >60 07/11/2020 1738       Component Value Date/Time   WBC 5.4 07/11/2020 1738   RBC 4.24 07/11/2020 1738   HGB 13.0 07/11/2020 1738   HCT 37.8 07/11/2020 1738   PLT 207 07/11/2020 1738   MCV 89.2 07/11/2020 1738   MCH 30.7 07/11/2020 1738   MCHC 34.4 07/11/2020 1738   RDW 12.0 07/11/2020 1738   LYMPHSABS 2.2 05/23/2020 0828   MONOABS 0.4 05/23/2020 0828   EOSABS 0.2 05/23/2020 0828   BASOSABS 0.1 05/23/2020 0828    No results found for: POCLITH, LITHIUM   No results found for: PHENYTOIN, PHENOBARB, VALPROATE, CBMZ   .res Assessment: Plan:     Plan:  PDMP reviewed  1. Adderall 30mg  daily. 2. Propanolol  10mg  BID for anxiety 3. D/C Abilify 2mg  daily 4. Add Pristiq 50mg  daily  111/66/84  40/58 Attention Deficit Disorder likely  Meets DSM 5 criteria for ADHD  RTC 4 weeks  Continue therapy with  Patient advised to contact office with any questions, adverse effects, or acute worsening in signs and symptoms.   Time spent with patient was 25 minutes. Greater than 50% of face to face time with patient was spent on counseling and coordination of care.   Diagnoses and all orders for this visit:  Attention deficit hyperactivity disorder (ADHD), unspecified ADHD type  Generalized anxiety disorder -     desvenlafaxine (PRISTIQ) 50 MG 24 hr tablet; Take 1 tablet (50 mg total) by mouth daily. -     propranolol (  INDERAL) 10 MG tablet; Take 1 tablet (10 mg total) by mouth 2 (two) times daily.  Major depressive disorder, recurrent episode, moderate (HCC)    Please see After Visit Summary for patient specific instructions.  Future Appointments  Date Time Provider Department Center  01/29/2021 12:00 PM Mathis Fare, LCSW [facility] None  02/12/2021  1:00 PM Mathis Fare, LCSW [facility] None  03/03/2021  8:00 AM Mathis Fare, LCSW [facility] None  03/19/2021 12:00 PM Mathis Fare, LCSW [facility] None  04/02/2021 12:00 PM Mathis Fare, LCSW [facility] None  04/22/2021  1:00 PM Zoila Ditullio, Thereasa Solo, NP [facility] None  05/26/2021  2:00 PM Willow Ora, MD [facility]    No orders of the defined types were placed in this encounter.   -------------------------------

## 2021-01-22 ENCOUNTER — Other Ambulatory Visit (HOSPITAL_COMMUNITY): Payer: Self-pay

## 2021-01-22 ENCOUNTER — Other Ambulatory Visit: Payer: Self-pay | Admitting: Adult Health

## 2021-01-22 DIAGNOSIS — F909 Attention-deficit hyperactivity disorder, unspecified type: Secondary | ICD-10-CM

## 2021-01-22 MED ORDER — AMPHETAMINE-DEXTROAMPHETAMINE 30 MG PO TABS
30.0000 mg | ORAL_TABLET | Freq: Every day | ORAL | 0 refills | Status: DC
  Filled 2021-01-22: qty 30, 30d supply, fill #0

## 2021-01-22 NOTE — Telephone Encounter (Signed)
Patient called in refill for Adderall 30mg . States she would like prescription sent to a different . pharmacy. Ph: [phone]. Appt 2/7. Pharmacy [facility] [address] [place]

## 2021-01-29 ENCOUNTER — Other Ambulatory Visit: Payer: Self-pay

## 2021-01-29 ENCOUNTER — Ambulatory Visit: Payer: BC Managed Care – PPO | Admitting: Psychiatry

## 2021-01-29 DIAGNOSIS — F411 Generalized anxiety disorder: Secondary | ICD-10-CM

## 2021-01-29 NOTE — Progress Notes (Signed)
[facility] Counselor/Therapist Progress Note  Patient ID: Casey Santana, MRN: [id number],    Date: 01/29/2021  Time Spent: 50 minutes   Treatment Type: Individual Therapy  Reported Symptoms: anxiety, depression, lower motivation  Mental Status Exam:  Appearance:   Casual     Behavior:  Appropriate, Sharing, and lower motivation  Motor:  Normal  Speech/Language:   Clear and Coherent  Affect:  Depressed and anxious  Mood:  anxious and depressed  Thought process:  goal directed  Thought content:    Some obsessiveness  Sensory/Perceptual disturbances:    WNL  Orientation:  oriented to person, place, time/date, situation, day of week, month of year, year, and stated date of Nov. 16, 2022  Attention:  Fair  Concentration:  Fair  Memory:  WNL  Fund of knowledge:   Good  Insight:    Good and Fair  Judgment:   Good and Fair  Impulse Control:  Good and Fair   Risk Assessment: Danger to Self:  No Self-injurious Behavior: No Danger to Others: No Duty to Warn:no Physical Aggression / Violence:No  Access to Firearms a concern: No  Gang Involvement:No   Subjective: Patient in today reporting anxiety,depression, and lower motivation mostly regarding her schoolwork and some at her job. Procrastination "is my biggest problem right now and I end up rushing through things." No self-cutting and has reached Day 414 of "no cutting self". Worked today on her procrastination and figuring out strategies that can help her make different choices in completing her school work ahead of time rather than constantly putting it off and stressed  at the end.  Grades are quite good however. She has started journaling some recently. Shared some of her journaling since last session and discussed some of it related to "being validated". Discussed in session her validation issues as well as how she sees herself.  Also worked on her low motivation and what helps versus does not help and how to improve her  motivation and being more proactive and addressing things more quickly, rather than resulting in procrastination which seems to have become more habitual and also increased her self negating..  Discussed this using specific examples that are relative to patient and she is to try making some changes as discussed in session today between now and next session.  Trying to work on stopping her overthinking as well as decreasing her negative outlook as both of these she acknowledges, hold her back from moving forward and making needed changes.  Also working on some issues of trust and believing people when they give her positive feedback or "praise" rather than second-guessing them or overthinking them.  Some focus on her sad feelings regarding family communication which we will discuss more again next session.  Does seem to focus better when she brings and written notes from between sessions.  Interventions: Insight-Oriented  Diagnosis:   ICD-10-CM   1. Generalized anxiety disorder  F41.1      Treatment goal plan:  Patient not signing tx plan on computer screen due to Covid.  Treatment Goals: Goal remain on tx plan as patient works with strategies to achieve her goals.  Progress is noted each visit in "Progress" section of Plan. Long term goal: Develop healthy cognitive patterns and beliefs about self and the world that help alleviate depression/anxiety and any thoughts to harm self, and also help prevent relapse of depression. Short term goal: Identify and replace depressive thinking that leads to depressive/anxious feelings and actions.  Strategy: Identify depressive/anxious/negative thoughts and self-talk that lead to depression/anxiety and work interrupt those cognitions and replace with more reality-based, positive, and empowering thoughts and self-talk.    Plan: Patient today showing lower motivation but did participate well in session and seemed to be more motivated by the end of session.   Worked on issues today including validation, difficulty accepting positive feedback from others, some issues connecting well with others, and self judgment.  Does seem to interact better in sessions and really deal with issues when she brings in written notes with her.  Encouraged patient in her practice of positive behaviors including: Believing in herself more. Refrain from any self cutting which she is really doing well with at this point. Being in touch with at least 2 friends per week. Finding the positives within herself. Consistent positive self talk. Staying in contact with supportive people. Good self-care as discussed in session. Getting outside daily and walking. Remaining in the present focusing on what she can change. Interrupt anxious/negative thoughts and challenge them to replace with more realistic thoughts. Look for more positives versus negatives each day. Find the positives within herself. Continuing to work on elevating her self-esteem and self-confidence. Believe that she can make good decisions and make progress on her goals. Practicing healthy boundaries with others. Saying no when she needs to say no. Feel good about the strength she shows working with goal-directed behaviors to move in a direction of improved emotional health.  Goal review and progress/challenges noted with patient.  Next appointment within 2 to 3 weeks.  This record has been created using [employer].  Chart creation errors have been sought, but may not always have been located and corrected.  Such creation errors do not reflect on the standard of medical care provided.    Mathis Fare, LCSW

## 2021-02-12 ENCOUNTER — Other Ambulatory Visit: Payer: Self-pay

## 2021-02-12 ENCOUNTER — Ambulatory Visit: Payer: BC Managed Care – PPO | Admitting: Psychiatry

## 2021-02-12 DIAGNOSIS — F411 Generalized anxiety disorder: Secondary | ICD-10-CM | POA: Diagnosis not present

## 2021-02-12 NOTE — Progress Notes (Signed)
[facility] Counselor/Therapist Progress Note  Patient ID: Casey Santana, MRN: [id number],    Date: 02/12/2021  Time Spent: 52   Treatment Type: Individual Therapy  Reported Symptoms: anxiety, depression  Mental Status Exam:  Appearance:   Casual     Behavior:  Appropriate, Sharing, and Motivated  Motor:  Normal  Speech/Language:   Clear and Coherent  Affect:  Anxious, some depression  Mood:  anxious and depressed  Thought process:  normal  Thought content:    WNL  Sensory/Perceptual disturbances:    WNL  Orientation:  oriented to person, place, time/date, situation, day of week, month of year, year, and stated date of Nov. 30, 2022  Attention:  Fair  Concentration:  Fair  Memory:  WNL  Fund of knowledge:   Good  Insight:    Fair  Judgment:   Good  Impulse Control:  Fair   Risk Assessment: Danger to Self:  No Self-injurious Behavior: No Danger to Others: No Duty to Warn:no Physical Aggression / Violence:No  Access to Firearms a concern: No  Gang Involvement:No   Subjective: Patient in today reporting Day 428 of no self-cutting and feeling good about that. Struggling some with motivation, more so in her school work.  Anxiety some better but not significantly.  Not following through on homework.  Working more today on her need for more social contacts and friends, although she avoids this, which we discussed at length.  Looking at what blocks her or in some cases how she blocks herself and for what reason.  She has stated this is a priority.  Did participate more in her discussion today which seemed helpful to patient especially to take ownership of actually following through with suggestions and trying new ways of interacting with people and efforts to have opportunities for more social contacts and developing friendships.  Worked some also on some self-esteem issues and explored what has led to some of her uneasiness in social situations.  She is to do some journaling  about this in between now and next session and also to follow through on getting the contact name for a local school speech pathologist to speak with as patient has continued to have an interest in that as a career option although is resistant to the educational requirements that she would need to meet.  Interventions: Solution-Oriented/Positive Psychology, Ego-Supportive, and Insight-Oriented  Treatment goal plan:  Patient not signing tx plan on computer screen due to Covid.  Treatment Goals: Goal remain on tx plan as patient works with strategies to achieve her goals.  Progress is noted each visit in "Progress" section of Plan. Long term goal: Develop healthy cognitive patterns and beliefs about self and the world that help alleviate depression/anxiety and any thoughts to harm self, and also help prevent relapse of depression. Short term goal: Identify and replace depressive thinking that leads to depressive/anxious feelings and actions. Strategy: Identify depressive/anxious/negative thoughts and self-talk that lead to depression/anxiety and work interrupt those cognitions and replace with more reality-based, positive, and empowering thoughts and self-talk.    Diagnosis:   ICD-10-CM   1. Generalized anxiety disorder  F41.1      Plan: Patient today showing a little better motivation than last session but still needing stronger motivation to make more gains.  As the session continued her motivation seemed to improve at least while in the session and she was more active and participating.  Worked more today on issues including being able to be more  social and develop more friendships, anxiety reduction, validation, difficulty accepting positive feedback from others, and not judging herself.  Encouraged patient not to bring up things at the very end of session but instead for something she knows is important, please share it at the beginning of session so we can plan accordingly to use our time in  the best way.  Encouraged patient and her practice of positive behaviors including:   Believing in herself more and her ability to make changes. Consistent positive self talk. Refrain from any self cutting, which she is really doing well with at this point, as noted above. Being in touch with at least 2 friends per week, 1 can be her boyfriend. Finding the positives within herself. Staying in contact with supportive people. Interrupt anxious/negative thoughts, challenge them to replace with more realistic thoughts. Good self-care as discussed in session. Getting outside daily and walking as she is able. Look for more positives versus negatives each day. Remaining in the present focusing on what she can change. Continuing to work on elevating her self-esteem and self-confidence. Believe that she can make good decisions and make progress on her goals. Practice healthy boundaries with others as needed. Saying no when she needs to say no. Asking for help when she needs it. Recognize the strength she has working on goal-directed behaviors to move in a direction of improved emotional health.   Goal review and progress/challenges noted with patient.  Next appointment within the next 2 to 3 weeks.  This record has been created using [employer].  Chart creation errors have been sought, but may not always have been located and corrected.  Such creation errors do not reflect on the standard of medical care provided.     Mathis Fare, LCSW

## 2021-02-21 ENCOUNTER — Telehealth: Payer: Self-pay | Admitting: Adult Health

## 2021-02-21 NOTE — Telephone Encounter (Signed)
Patient Casey Santana requesting a refill for the generic Adderall. Fill at the [facility] pharmacy. Patient was last seen in the office on 11/8, with a follow up scheduled for 2/7.

## 2021-02-25 ENCOUNTER — Other Ambulatory Visit (HOSPITAL_COMMUNITY): Payer: Self-pay

## 2021-02-25 ENCOUNTER — Other Ambulatory Visit: Payer: Self-pay

## 2021-02-25 DIAGNOSIS — F909 Attention-deficit hyperactivity disorder, unspecified type: Secondary | ICD-10-CM

## 2021-02-25 MED ORDER — AMPHETAMINE-DEXTROAMPHETAMINE 30 MG PO TABS
30.0000 mg | ORAL_TABLET | Freq: Every day | ORAL | 0 refills | Status: DC
  Filled 2021-02-25: qty 30, 30d supply, fill #0

## 2021-02-25 MED ORDER — AMPHETAMINE-DEXTROAMPHETAMINE 30 MG PO TABS
30.0000 mg | ORAL_TABLET | Freq: Every day | ORAL | 0 refills | Status: DC
  Filled 2021-03-28: qty 30, 30d supply, fill #0

## 2021-02-25 NOTE — Telephone Encounter (Signed)
Pended.

## 2021-02-25 NOTE — Telephone Encounter (Signed)
Pt called back asking for the Adderall script to be sent to [facility] at [facility] location.    Next appt 2/7

## 2021-03-03 ENCOUNTER — Other Ambulatory Visit: Payer: Self-pay

## 2021-03-03 ENCOUNTER — Ambulatory Visit: Payer: BC Managed Care – PPO | Admitting: Psychiatry

## 2021-03-03 DIAGNOSIS — F411 Generalized anxiety disorder: Secondary | ICD-10-CM

## 2021-03-03 NOTE — Progress Notes (Signed)
[facility] Counselor/Therapist Progress Note  Patient ID: Casey Santana, MRN: [id number],    Date: 03/03/2021  Time Spent: 45 minutes   Treatment Type: Individual Therapy  Reported Symptoms: anxiety, depression, motivation  Mental Status Exam:  Appearance:   Casual     Behavior:  Appropriate and Sharing  Motor:  Normal  Speech/Language:   Clear and Coherent  Affect:  Depressed and anxious  Mood:  anxious and depressed  Thought process:  goal directed  Thought content:    overthinking  Sensory/Perceptual disturbances:    WNL  Orientation:  oriented to person, place, time/date, situation, day of week, month of year, year, and stated date of Dec. 19, 2022  Attention:  Fair  Concentration:  Fair  Memory:  WNL  Fund of knowledge:   Good  Insight:    Fair  Judgment:   Good  Impulse Control:  Fair   Risk Assessment: Danger to Self:  No Self-injurious Behavior: No Danger to Others: No Duty to Warn:no Physical Aggression / Violence:No  Access to Firearms a concern: No  Gang Involvement:No   Subjective: Patient in today reporting low motivation and continued anxiety. Reports day 447 of no self-cutting and feeling good about that. On break from school but still having difficulty following through on strategies.  Follow through on goal-directed behaviors has been difficult at times.  Discussed more openly with patient today her hopes and desires and her motivation.  Is considering seeing a therapist to "might relate to her better as non-binary, may be a non-binary therapist "and we discussed this more at length.  I reviewed with her the difficulty she has had in following through at times and  totally support if she feels she might like to try another therapist, including mentioned some options.  Definitely encouraged her in her remaining free of any self cutting as she has done a really good job with that.  Encouraged her to think through what she wants to do going forward and  assured her of my support in her making her own best decision and move forward.  She seemed to feel comfortable taking some time to make that decision and knows that she can call our office in the meantime if support is needed, and will also remain with her medication provider to continue medications.  Interventions: Solution-Oriented/Positive Psychology and Ego-Supportive  Treatment goal plan:  Patient not signing tx plan on computer screen due to Covid.  Treatment Goals: Goal remain on tx plan as patient works with strategies to achieve her goals.  Progress is noted each visit in "Progress" section of Plan. Long term goal: Develop healthy cognitive patterns and beliefs about self and the world that help alleviate depression/anxiety and any thoughts to harm self, and also help prevent relapse of depression. Short term goal: Identify and replace depressive thinking that leads to depressive/anxious feelings and actions. Strategy: Identify depressive/anxious/negative thoughts and self-talk that lead to depression/anxiety and work interrupt those cognitions and replace with more reality-based, positive, and empowering thoughts and self-talk.    Diagnosis:   ICD-10-CM   1. Generalized anxiety disorder  F41.1      Plan: Patient today showing some motivation and self confidence and talking about her goals and going forward.  We did discuss issues that she may relate better with a therapist who identifies or works more with nonbinary patients, and also wanting one that does online therapy only.  I encouraged patient to take the time she needs and  think through this and certainly supported her decision based on what ever she feels is best for her at this point in time.  She has made some progress and I believe her medication has helped, but has been difficult for patient to move forward and stay focused with homework that is encouraged and would be supportive of work in sessions.  Often would bring up  issues at end of session and we would have limited time at that point to truly focus on them.  Encouraged patient to believe in herself and her ability to make changes, and take some time to think through what she feels is best for her and I support her moving forward in whatever way she feels is going to most help her.    Goal review and progress/challenges noted with patient.  Next appointment within 2 to 3 weeks.  This record has been created using [employer].  Chart creation errors have been sought, but may not always have been located and corrected.  Such creation errors do not reflect on the standard of medical care provided.   Mathis Fare, LCSW

## 2021-03-19 ENCOUNTER — Ambulatory Visit: Payer: BC Managed Care – PPO | Admitting: Psychiatry

## 2021-03-28 ENCOUNTER — Other Ambulatory Visit (HOSPITAL_COMMUNITY): Payer: Self-pay

## 2021-04-02 ENCOUNTER — Ambulatory Visit: Payer: BC Managed Care – PPO | Admitting: Psychiatry

## 2021-04-16 ENCOUNTER — Ambulatory Visit: Payer: BC Managed Care – PPO | Admitting: Psychiatry

## 2021-04-22 ENCOUNTER — Encounter: Payer: Self-pay | Admitting: Adult Health

## 2021-04-22 ENCOUNTER — Ambulatory Visit: Payer: BC Managed Care – PPO | Admitting: Adult Health

## 2021-04-22 ENCOUNTER — Other Ambulatory Visit (HOSPITAL_COMMUNITY): Payer: Self-pay

## 2021-04-22 ENCOUNTER — Other Ambulatory Visit: Payer: Self-pay

## 2021-04-22 DIAGNOSIS — F331 Major depressive disorder, recurrent, moderate: Secondary | ICD-10-CM | POA: Diagnosis not present

## 2021-04-22 DIAGNOSIS — F411 Generalized anxiety disorder: Secondary | ICD-10-CM | POA: Diagnosis not present

## 2021-04-22 DIAGNOSIS — F909 Attention-deficit hyperactivity disorder, unspecified type: Secondary | ICD-10-CM | POA: Diagnosis not present

## 2021-04-22 MED ORDER — AMPHETAMINE-DEXTROAMPHETAMINE 30 MG PO TABS
30.0000 mg | ORAL_TABLET | Freq: Every day | ORAL | 0 refills | Status: DC
  Filled 2021-04-22 – 2021-05-05 (×2): qty 30, 30d supply, fill #0

## 2021-04-22 NOTE — Progress Notes (Signed)
Casey Santana [id number] July 09, 2000 20 y.o.  Subjective:   Patient ID:  Casey Santana is a 21 y.o. (DOB [birth year]) adult.  Chief Complaint: No chief complaint on file.   HPI Casey Santana presents to the office today for follow-up of MDD, ADHD, and GAD.  Describes mood today as "better". Pleasant. Tearfulness. Mood symptoms - reports anxiety, irritability and depression. Mood has been on the lower side. Stating "my boyfriend and I broke up 3 days ago".  Dated over a year - feels like the change to a college environment effected their relationship. Stopped Abilify due to restlessness and stopped Pristiq 2 weeks ago because it was not helping. Does not wish to add any additional medications at this time. Planning to work more with therapist. Varying interest and motivation.   Has started journaling. Improved interest and motivation. Taking medications as prescribed. Energy levels "alright". Active, has started a regular exercise routine.  Enjoys some usual interests and activities. Single. Lives at home with parents and 2 dogs - "[place] and Perlie [place]". Has a 100 year old brother - [place] - graduates in May. Spending time with family.  Appetite adequate. Weight stable - 126 pounds. Sleeps well most nights. Averages 7 to 8 hours. Focus and concentration "alright". Completing tasks. Managing aspects of household. Work going well - works 25 to 30 hours a week at [employer]. Taking 3 class currently. Denies self harm since September 2021. Clean 497 days from self harm. Denies SI or HI.  Denies AH or VH.  Denies substance use, over spending, or reckless behaviors.   Previous medication trials: Lexapro, Prozac, Propanolol, Cymbalta, Wellbutrin XL, Abilify, Pristiq   GAD-7    Flowsheet Row Office Visit from 02/13/2019 in [facility] from 02/24/2018 in [facility]  Total GAD-7 Score 14 9      PHQ2-9     Flowsheet Row Office Visit from 12/16/2020 in [facility] Office Visit from 06/19/2020 in [facility] from 05/12/2019 in [facility] from 04/27/2019 in [facility] from 02/13/2019 in [facility]  PHQ-2 Total Score 0 0 4 4 2   PHQ-9 Total Score -- -- 19 20 19       Flowsheet Row ED from 07/11/2020 in [facility] Office Visit from 02/13/2019 in [facility]  C-SSRS RISK CATEGORY No Risk Error: Question 6 not populated        Review of Systems:  Review of Systems  Medications: I have reviewed the patient's current medications.  Current Outpatient Medications  Medication Sig Dispense Refill   amphetamine-dextroamphetamine (ADDERALL) 30 MG tablet Take 1 tablet by mouth daily. 30 tablet 0   amphetamine-dextroamphetamine (ADDERALL) 30 MG tablet Take 1 tablet by mouth daily. 30 tablet 0   amphetamine-dextroamphetamine (ADDERALL) 30 MG tablet Take 1 tablet by mouth daily. 30 tablet 0   doxycycline (VIBRAMYCIN) 100 MG capsule Take 1 capsule (100 mg total) by mouth 2 (two) times daily. Take with food as can cause GI distress. 14 capsule 0   EPINEPHrine (EPIPEN 2-PAK) 0.3 mg/0.3 mL IJ SOAJ injection Inject 0.3 mg into the muscle as needed for anaphylaxis. 1 each 2   propranolol (INDERAL) 10 MG tablet Take 1 tablet (10 mg total) by mouth 2 (two) times daily. 60 tablet 5   No current facility-administered medications for this visit.    Medication Side Effects: None  Allergies:  Allergies  Allergen Reactions   Peanut-Containing Drug Products     Past Medical History:  Diagnosis Date   Anxiety    Depression    Dysmenorrhea    Heart murmur     Past Medical History, Surgical history, Social history, and Family history were reviewed and updated as appropriate.   Please see review of systems  for further details on the patient's review from today.   Objective:   Physical Exam:  There were no vitals taken for this visit.  Physical Exam  Lab Review:     Component Value Date/Time   NA 139 07/11/2020 1738   K 3.4 (L) 07/11/2020 1738   CL 106 07/11/2020 1738   CO2 25 07/11/2020 1738   GLUCOSE 100 (H) 07/11/2020 1738   BUN 8 07/11/2020 1738   CREATININE 0.68 07/11/2020 1738   CALCIUM 9.9 07/11/2020 1738   GFRNONAA >60 07/11/2020 1738       Component Value Date/Time   WBC 5.4 07/11/2020 1738   RBC 4.24 07/11/2020 1738   HGB 13.0 07/11/2020 1738   HCT 37.8 07/11/2020 1738   PLT 207 07/11/2020 1738   MCV 89.2 07/11/2020 1738   MCH 30.7 07/11/2020 1738   MCHC 34.4 07/11/2020 1738   RDW 12.0 07/11/2020 1738   LYMPHSABS 2.2 05/23/2020 0828   MONOABS 0.4 05/23/2020 0828   EOSABS 0.2 05/23/2020 0828   BASOSABS 0.1 05/23/2020 0828    No results found for: POCLITH, LITHIUM   No results found for: PHENYTOIN, PHENOBARB, VALPROATE, CBMZ   .res Assessment: Plan:    Plan:  PDMP reviewed  1. Adderall 30mg  daily. 2. Propanolol 10mg  BID for anxiety  114/78/93  40/58 Attention Deficit Disorder likely  Meets DSM 5 criteria for ADHD  RTC 3 months  Continue therapy with  Patient advised to contact office with any questions, adverse effects, or acute worsening in signs and symptoms.  Discussed potential benefits, risks, and side effects of stimulants with patient to include increased heart rate, palpitations, insomnia, increased anxiety, increased irritability, or decreased appetite.  Instructed patient to contact office if experiencing any significant tolerability issues.    Diagnoses and all orders for this visit:  Generalized anxiety disorder  Attention deficit hyperactivity disorder (ADHD), unspecified ADHD type -     amphetamine-dextroamphetamine (ADDERALL) 30 MG tablet; Take 1 tablet by mouth daily.  Major depressive disorder, recurrent  episode, moderate (HCC)     Please see After Visit Summary for patient specific instructions.  Future Appointments  Date Time Provider Department Center  05/26/2021  9:50 AM Early, Rockne Menghini, NP [facility]  05/26/2021  2:00 PM Sung Amabile, MD [facility]  07/22/2021  1:00 PM Jomel Whittlesey, Willow Ora, NP [facility] None    No orders of the defined types were placed in this encounter.   -------------------------------

## 2021-05-05 ENCOUNTER — Other Ambulatory Visit (HOSPITAL_COMMUNITY): Payer: Self-pay

## 2021-05-26 ENCOUNTER — Encounter: Payer: BC Managed Care – PPO | Admitting: Family Medicine

## 2021-05-26 ENCOUNTER — Ambulatory Visit (HOSPITAL_BASED_OUTPATIENT_CLINIC_OR_DEPARTMENT_OTHER): Payer: BC Managed Care – PPO | Admitting: Nurse Practitioner

## 2021-05-27 ENCOUNTER — Telehealth (HOSPITAL_BASED_OUTPATIENT_CLINIC_OR_DEPARTMENT_OTHER): Payer: Self-pay | Admitting: Nurse Practitioner

## 2021-06-05 ENCOUNTER — Other Ambulatory Visit (HOSPITAL_COMMUNITY): Payer: Self-pay

## 2021-06-05 ENCOUNTER — Other Ambulatory Visit: Payer: Self-pay

## 2021-06-05 ENCOUNTER — Telehealth: Payer: Self-pay | Admitting: Adult Health

## 2021-06-05 DIAGNOSIS — F909 Attention-deficit hyperactivity disorder, unspecified type: Secondary | ICD-10-CM

## 2021-06-05 MED ORDER — AMPHETAMINE-DEXTROAMPHETAMINE 30 MG PO TABS
30.0000 mg | ORAL_TABLET | Freq: Every day | ORAL | 0 refills | Status: DC
  Filled 2021-06-05: qty 30, 30d supply, fill #0

## 2021-06-05 NOTE — Telephone Encounter (Signed)
Pended.

## 2021-06-05 NOTE — Telephone Encounter (Signed)
Micha called today at 10:34 to request refill of her Adderall.  Appt 07/22/21.  Send to [facility] pharmacy.  She said they do have it. ?

## 2021-06-12 ENCOUNTER — Encounter (HOSPITAL_BASED_OUTPATIENT_CLINIC_OR_DEPARTMENT_OTHER): Payer: Self-pay | Admitting: Nurse Practitioner

## 2021-06-18 NOTE — Telephone Encounter (Signed)
LVM to reschedule NP appt with DNP Minna Merritts Early  ?

## 2021-07-07 ENCOUNTER — Other Ambulatory Visit: Payer: Self-pay | Admitting: Adult Health

## 2021-07-07 ENCOUNTER — Other Ambulatory Visit (HOSPITAL_COMMUNITY): Payer: Self-pay

## 2021-07-07 ENCOUNTER — Telehealth: Payer: Self-pay | Admitting: Adult Health

## 2021-07-07 DIAGNOSIS — F909 Attention-deficit hyperactivity disorder, unspecified type: Secondary | ICD-10-CM

## 2021-07-07 MED ORDER — AMPHETAMINE-DEXTROAMPHETAMINE 30 MG PO TABS
30.0000 mg | ORAL_TABLET | Freq: Every day | ORAL | 0 refills | Status: DC
  Filled 2021-07-07: qty 30, 30d supply, fill #0

## 2021-07-07 NOTE — Telephone Encounter (Signed)
Script sent  

## 2021-07-07 NOTE — Telephone Encounter (Signed)
Pt requesting Adderall Rx to [facility].  Have in stock.Apt 5/9    ?

## 2021-07-22 ENCOUNTER — Ambulatory Visit: Payer: BC Managed Care – PPO | Admitting: Adult Health

## 2021-07-22 ENCOUNTER — Other Ambulatory Visit (HOSPITAL_COMMUNITY): Payer: Self-pay

## 2021-07-22 ENCOUNTER — Encounter: Payer: Self-pay | Admitting: Adult Health

## 2021-07-22 DIAGNOSIS — F331 Major depressive disorder, recurrent, moderate: Secondary | ICD-10-CM | POA: Diagnosis not present

## 2021-07-22 DIAGNOSIS — F909 Attention-deficit hyperactivity disorder, unspecified type: Secondary | ICD-10-CM | POA: Diagnosis not present

## 2021-07-22 DIAGNOSIS — F411 Generalized anxiety disorder: Secondary | ICD-10-CM | POA: Diagnosis not present

## 2021-07-22 MED ORDER — AMPHETAMINE-DEXTROAMPHETAMINE 30 MG PO TABS
30.0000 mg | ORAL_TABLET | Freq: Every day | ORAL | 0 refills | Status: DC
Start: 2021-08-19 — End: 2021-10-20
  Filled 2021-09-15: qty 30, 30d supply, fill #0

## 2021-07-22 MED ORDER — AMPHETAMINE-DEXTROAMPHETAMINE 30 MG PO TABS: 30.0000 mg | ORAL_TABLET | Freq: Every day | ORAL | 0 refills | Status: DC

## 2021-07-22 MED ORDER — AMPHETAMINE-DEXTROAMPHETAMINE 30 MG PO TABS
30.0000 mg | ORAL_TABLET | Freq: Every day | ORAL | 0 refills | Status: DC
  Filled 2021-07-22 – 2021-08-08 (×2): qty 30, 30d supply, fill #0

## 2021-07-22 MED ORDER — PROPRANOLOL HCL 10 MG PO TABS
10.0000 mg | ORAL_TABLET | Freq: Two times a day (BID) | ORAL | 5 refills | Status: DC
  Filled 2021-07-22 – 2021-08-28 (×2): qty 60, 30d supply, fill #0

## 2021-07-22 NOTE — Progress Notes (Signed)
[name] ?[id number] ?[birth year] ?21 y.o. ? ?Subjective:  ? ?Patient ID:  Casey Santana is a 21 y.o. (DOB 10-16-2000) adult. ? ?Chief Complaint: No chief complaint on file. ? ? ?HPI ?Casey Santana presents to the office today for follow-up of MDD, ADHD, and GAD. ? ?Describes mood today as "ok". Pleasant. Tearfulness. Mood symptoms - reports some anxiety, irritability and depression. Mood is stable. Feels like current medications are working well. Stating "I'm doing ok". Working with therapist on [place]. Varying interest and motivation. Improved interest and motivation. Taking medications as prescribed. ?Energy levels "alright". Active, exercising some.   ?Enjoys some usual interests and activities. Single. Lives at home with parents and 2 dogs - "Casey Santana and Casey Santana". 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 Spending time with family.  ?Appetite adequate. Weight stable - 126 pounds. ?Sleeps well most nights. Averages 7 to 8 hours. ?Focus and concentration "ok". Completing tasks. Managing aspects of household. Work going well - works 25 to 30 hours a week at [employer]. Taking the summer off from school. Considering animal training. ?Denies self harm since September 2021. Clean 588 days from self harm. ?Denies SI or HI.  ?Denies AH or VH.  ?Denies substance use, over spending, or reckless behaviors.  ? ?Previous medication trials: Lexapro, Prozac, Propanolol, Cymbalta, Wellbutrin XL, Abilify, Pristiq ? ? ?GAD-7   ? ?Flowsheet Row Office Visit from 02/13/2019 in [facility] from 02/24/2018 in [facility]  ?Total GAD-7 Score 14 9  ? ?  ? ?PHQ2-9   ? ?Flowsheet Row Office Visit from 12/16/2020 in [facility] Office Visit from 06/19/2020 in [facility] from 05/12/2019 in [facility] from 04/27/2019 in [facility] from 02/13/2019 in [facility]  ?PHQ-2 Total Score 0 0 4 4 2   ?PHQ-9 Total Score -- -- 19 20 19   ? ?  ? ?Flowsheet Row ED from 07/11/2020 in [facility] Office Visit from 02/13/2019 in [facility]  ?C-SSRS RISK CATEGORY No Risk Error: Question 6 not populated  ? ?  ?  ? ?Review of Systems:  ?Review of Systems  ?Musculoskeletal:  Negative for gait problem.  ?Neurological:  Negative for tremors.  ?Psychiatric/Behavioral:    ?     Please refer to HPI  ? ?Medications: I have reviewed the patient's current medications. ? ?Current Outpatient Medications  ?Medication Sig Dispense Refill  ? amphetamine-dextroamphetamine (ADDERALL) 30 MG tablet Take 1 tablet by mouth daily. 30 tablet 0  ? [START ON 08/19/2021] amphetamine-dextroamphetamine (ADDERALL) 30 MG tablet Take 1 tablet by mouth daily. 30 tablet 0  ? [START ON 09/16/2021] amphetamine-dextroamphetamine (ADDERALL) 30 MG tablet Take 1 tablet by mouth daily. 30 tablet 0  ? doxycycline (VIBRAMYCIN) 100 MG capsule Take 1 capsule (100 mg total) by mouth 2 (two) times daily. Take with food as can cause GI distress. 14 capsule 0  ? EPINEPHrine (EPIPEN 2-PAK) 0.3 mg/0.3 mL IJ SOAJ injection Inject 0.3 mg into the muscle as needed for anaphylaxis. 1 each 2  ? propranolol (INDERAL) 10 MG tablet Take 1 tablet (10 mg total) by mouth 2 (two) times daily. 60 tablet 5  ? ?No current facility-administered medications for this visit.  ? ? ?Medication Side Effects: None ? ?Allergies:  ?Allergies  ?Allergen Reactions  ? Avocado   ? Peanut Allergen Powder-Dnfp   ?  Peanut-Containing Drug Products   ? ? ?Past Medical History:  ?Diagnosis Date  ? Anxiety   ? Depression   ? Dysmenorrhea   ? Heart murmur   ? ? ?Past Medical History, Surgical history, Social history, and Family history were reviewed and updated as appropriate.  ? ?Please see review of systems for further details on the patient's review from  today.  ? ?Objective:  ? ?Physical Exam:  ?There were no vitals taken for this visit. ? ?Physical Exam ?Constitutional:   ?   General: She is not in acute distress. ?Musculoskeletal:     ?   General: No deformity.  ?Neurological:  ?   Mental Status: She is alert and oriented to person, place, and time.  ?   Coordination: Coordination normal.  ?Psychiatric:     ?   Attention and Perception: Attention and perception normal. She does not perceive auditory or visual hallucinations.     ?   Mood and Affect: Mood normal. Mood is not anxious or depressed. Affect is not labile, blunt, angry or inappropriate.     ?   Speech: Speech normal.     ?   Behavior: Behavior normal.     ?   Thought Content: Thought content normal. Thought content is not paranoid or delusional. Thought content does not include homicidal or suicidal ideation. Thought content does not include homicidal or suicidal plan.     ?   Cognition and Memory: Cognition and memory normal.     ?   Judgment: Judgment normal.  ?   Comments: Insight intact  ? ? ?Lab Review:  ?   ?Component Value Date/Time  ? NA 139 07/11/2020 1738  ? K 3.4 (L) 07/11/2020 1738  ? CL 106 07/11/2020 1738  ? CO2 25 07/11/2020 1738  ? GLUCOSE 100 (H) 07/11/2020 1738  ? BUN 8 07/11/2020 1738  ? CREATININE 0.68 07/11/2020 1738  ? CALCIUM 9.9 07/11/2020 1738  ? GFRNONAA >60 07/11/2020 1738  ? ? ?   ?Component Value Date/Time  ? WBC 5.4 07/11/2020 1738  ? RBC 4.24 07/11/2020 1738  ? HGB 13.0 07/11/2020 1738  ? HCT 37.8 07/11/2020 1738  ? PLT 207 07/11/2020 1738  ? MCV 89.2 07/11/2020 1738  ? MCH 30.7 07/11/2020 1738  ? MCHC 34.4 07/11/2020 1738  ? RDW 12.0 07/11/2020 1738  ? LYMPHSABS 2.2 05/23/2020 0828  ? MONOABS 0.4 05/23/2020 0828  ? EOSABS 0.2 05/23/2020 0828  ? BASOSABS 0.1 05/23/2020 0828  ? ? ?No results found for: POCLITH, LITHIUM  ? ?No results found for: PHENYTOIN, PHENOBARB, VALPROATE, CBMZ  ? ?.res ?Assessment: Plan:   ? ?Plan: ? ?PDMP reviewed ? ?1. Adderall 30mg  daily. ?2.  Propanolol 10mg  BID for anxiety ? ?103/77/87 ? ?40/58 Attention Deficit Disorder likely ? ?Meets DSM 5 criteria for ADHD ? ?RTC 3 months ? ?Continue therapy with ? ?Patient advised to contact office with any questions, adverse effects, or acute worsening in signs and symptoms. ? ?Discussed potential benefits, risks, and side effects of stimulants with patient to include increased heart rate, palpitations, insomnia, increased anxiety, increased irritability, or decreased appetite.  Instructed patient to contact office if experiencing any significant tolerability issues.  ? ?Diagnoses and all orders for this visit: ? ?Major depressive disorder, recurrent episode, moderate (HCC) ? ?Attention deficit hyperactivity disorder (ADHD), unspecified ADHD type ?-     amphetamine-dextroamphetamine (ADDERALL) 30 MG tablet; Take 1 tablet by mouth daily. ?-     amphetamine-dextroamphetamine (ADDERALL)  30 MG tablet; Take 1 tablet by mouth daily. ?-     amphetamine-dextroamphetamine (ADDERALL) 30 MG tablet; Take 1 tablet by mouth daily. ? ?Generalized anxiety disorder ?-     propranolol (INDERAL) 10 MG tablet; Take 1 tablet (10 mg total) by mouth 2 (two) times daily. ? ?  ? ?Please see After Visit Summary for patient specific instructions. ? ?No future appointments. ? ? ?No orders of the defined types were placed in this encounter. ? ? ?------------------------------- ?

## 2021-07-31 ENCOUNTER — Other Ambulatory Visit (HOSPITAL_COMMUNITY): Payer: Self-pay

## 2021-08-08 ENCOUNTER — Other Ambulatory Visit (HOSPITAL_COMMUNITY): Payer: Self-pay

## 2021-08-28 ENCOUNTER — Other Ambulatory Visit (HOSPITAL_COMMUNITY): Payer: Self-pay

## 2021-09-11 ENCOUNTER — Telehealth: Payer: Self-pay | Admitting: Adult Health

## 2021-09-11 ENCOUNTER — Other Ambulatory Visit: Payer: Self-pay

## 2021-09-11 NOTE — Telephone Encounter (Signed)
Pt has refill on file

## 2021-09-11 NOTE — Telephone Encounter (Signed)
Next visit is 10/20/21. Requesting refill on Adderall 3 mg. It is in stock at [facility]. [facility] Phone:  [phone]  Fax:  [phone]

## 2021-09-15 ENCOUNTER — Other Ambulatory Visit (HOSPITAL_COMMUNITY): Payer: Self-pay

## 2021-10-17 ENCOUNTER — Telehealth: Payer: Self-pay | Admitting: Adult Health

## 2021-10-17 ENCOUNTER — Other Ambulatory Visit (HOSPITAL_COMMUNITY): Payer: Self-pay

## 2021-10-17 ENCOUNTER — Other Ambulatory Visit: Payer: Self-pay

## 2021-10-17 DIAGNOSIS — F411 Generalized anxiety disorder: Secondary | ICD-10-CM

## 2021-10-17 DIAGNOSIS — F909 Attention-deficit hyperactivity disorder, unspecified type: Secondary | ICD-10-CM

## 2021-10-17 MED ORDER — PROPRANOLOL HCL 10 MG PO TABS
10.0000 mg | ORAL_TABLET | Freq: Two times a day (BID) | ORAL | 2 refills | Status: DC
  Filled 2021-10-17: qty 60, 30d supply, fill #0

## 2021-10-17 MED ORDER — AMPHETAMINE-DEXTROAMPHETAMINE 30 MG PO TABS
30.0000 mg | ORAL_TABLET | Freq: Every day | ORAL | 0 refills | Status: DC
  Filled 2021-10-17: qty 30, 30d supply, fill #0

## 2021-10-17 NOTE — Telephone Encounter (Signed)
Next visit is 10/20/21. Requesting refill on Adderall 30 mg and Propanalol 10 mg called to:  [facility]  Phone:  [phone]  Fax:  [phone]

## 2021-10-17 NOTE — Telephone Encounter (Signed)
Pended.

## 2021-10-20 ENCOUNTER — Ambulatory Visit: Payer: BC Managed Care – PPO | Admitting: Adult Health

## 2021-10-20 ENCOUNTER — Encounter: Payer: Self-pay | Admitting: Adult Health

## 2021-10-20 ENCOUNTER — Other Ambulatory Visit (HOSPITAL_COMMUNITY): Payer: Self-pay

## 2021-10-20 DIAGNOSIS — F331 Major depressive disorder, recurrent, moderate: Secondary | ICD-10-CM

## 2021-10-20 DIAGNOSIS — F909 Attention-deficit hyperactivity disorder, unspecified type: Secondary | ICD-10-CM

## 2021-10-20 DIAGNOSIS — F411 Generalized anxiety disorder: Secondary | ICD-10-CM

## 2021-10-20 MED ORDER — AMPHETAMINE-DEXTROAMPHETAMINE 30 MG PO TABS
30.0000 mg | ORAL_TABLET | Freq: Every day | ORAL | 0 refills | Status: DC
  Filled 2021-11-25: qty 30, 30d supply, fill #0

## 2021-10-20 MED ORDER — PROPRANOLOL HCL 10 MG PO TABS
10.0000 mg | ORAL_TABLET | Freq: Two times a day (BID) | ORAL | 2 refills | Status: DC
  Filled 2021-10-21: qty 60, 30d supply, fill #0
  Filled 2021-11-25: qty 60, 30d supply, fill #1
  Filled 2021-12-28: qty 60, 30d supply, fill #2

## 2021-10-20 MED ORDER — AMPHETAMINE-DEXTROAMPHETAMINE 30 MG PO TABS
30.0000 mg | ORAL_TABLET | Freq: Every day | ORAL | 0 refills | Status: DC
Start: 2021-10-20 — End: 2022-01-23
  Filled 2021-10-20 – 2021-10-21 (×2): qty 30, 30d supply, fill #0

## 2021-10-20 MED ORDER — AMPHETAMINE-DEXTROAMPHETAMINE 30 MG PO TABS
30.0000 mg | ORAL_TABLET | Freq: Every day | ORAL | 0 refills | Status: DC
  Filled 2021-12-28: qty 30, 30d supply, fill #0

## 2021-10-20 NOTE — Progress Notes (Signed)
Casey Santana [id number] 05/29/2000 21 y.o.  Subjective:   Patient ID:  Casey Santana is a 21 y.o. (DOB 31-Mar-2000) adult.  Chief Complaint: No chief complaint on file.   HPI Lenni E Knowles presents to the office today for follow-up of MDD, ADHD, and GAD.  Describes mood today as "ok". Pleasant. Tearful. Mood symptoms - reports some anxiety, irritability and depression. Mood is consistent. Stating "I'm doing ok". Feels like current medications are working well. Working with therapist on [place]. Varying interest and motivation. Improved interest and motivation. Taking medications as prescribed. Energy levels stable. Active, exercising some.   Enjoys some usual interests and activities. Dating. Lives at home with parents and 2 dogs - "[place] and Perlie Gold". Has a brother - living in [place]. Spending time with family.  Appetite adequate. Weight stable - 126 pounds. Sleeps well most nights. Averages 7 to 8 hours. Focus and concentration "ok". Completing tasks. Managing aspects of household. Work going well - works 2 shifts a week at [employer]. Restarting school. Denies self harm since September 2021. Clean 588 days from self harm. Denies SI or HI.  Denies AH or VH.  Denies self harm. Denies substance use, over spending, or reckless behaviors.   Previous medication trials: Lexapro, Prozac, Propanolol, Cymbalta, Wellbutrin XL, Abilify, Pristiq   GAD-7    Flowsheet Row Office Visit from 02/13/2019 in [facility] from 02/24/2018 in [facility]  Total GAD-7 Score 14 9      PHQ2-9    Flowsheet Row Office Visit from 12/16/2020 in [facility] Office Visit from 06/19/2020 in [facility] from 05/12/2019 in [facility] from 04/27/2019 in [facility] from 02/13/2019 in [facility]  PHQ-2 Total Score 0 0 4 4 2   PHQ-9 Total Score -- -- 19 20 19       Flowsheet Row ED from 07/11/2020 in [facility] Office Visit from 02/13/2019 in [facility]  C-SSRS RISK CATEGORY No Risk Error: Question 6 not populated        Review of Systems:  Review of Systems  Musculoskeletal:  Negative for gait problem.  Neurological:  Negative for tremors.  Psychiatric/Behavioral:         Please refer to HPI    Medications: I have reviewed the patient's current medications.  Current Outpatient Medications  Medication Sig Dispense Refill   amphetamine-dextroamphetamine (ADDERALL) 30 MG tablet Take 1 tablet by mouth daily ([facility] 08/19/21) 30 tablet 0   amphetamine-dextroamphetamine (ADDERALL) 30 MG tablet Take 1 tablet by mouth daily ([facility] 09/16/21) 30 tablet 0   amphetamine-dextroamphetamine (ADDERALL) 30 MG tablet Take 1 tablet by mouth daily. 30 tablet 0   doxycycline (VIBRAMYCIN) 100 MG capsule Take 1 capsule (100 mg total) by mouth 2 (two) times daily. Take with food as can cause GI distress. 14 capsule 0   EPINEPHrine (EPIPEN 2-PAK) 0.3 mg/0.3 mL IJ SOAJ injection Inject 0.3 mg into the muscle as needed for anaphylaxis. 1 each 2   propranolol (INDERAL) 10 MG tablet Take 1 tablet (10 mg total) by mouth 2 (two) times daily. 60 tablet 2   No current facility-administered medications for this visit.    Medication Side Effects: None  Allergies:  Allergies  Allergen Reactions   Avocado    Peanut Allergen Powder-Dnfp    Peanut-Containing Drug Products     Past Medical History:  Diagnosis Date   Anxiety    Depression    Dysmenorrhea    Heart murmur     Past Medical History, Surgical history, Social history, and Family history were reviewed and updated as appropriate.   Please see review of systems for further details on the patient's review from today.   Objective:   Physical Exam:  There were no  vitals taken for this visit.  Physical Exam Constitutional:      General: She is not in acute distress. Musculoskeletal:        General: No deformity.  Neurological:     Mental Status: She is alert and oriented to person, place, and time.     Coordination: Coordination normal.  Psychiatric:        Attention and Perception: Attention and perception normal. She does not perceive auditory or visual hallucinations.        Mood and Affect: Mood normal. Mood is not anxious or depressed. Affect is not labile, blunt, angry or inappropriate.        Speech: Speech normal.        Behavior: Behavior normal.        Thought Content: Thought content normal. Thought content is not paranoid or delusional. Thought content does not include homicidal or suicidal ideation. Thought content does not include homicidal or suicidal plan.        Cognition and Memory: Cognition and memory normal.        Judgment: Judgment normal.     Comments: Insight intact     Lab Review:     Component Value Date/Time   NA 139 07/11/2020 1738   K 3.4 (L) 07/11/2020 1738   CL 106 07/11/2020 1738   CO2 25 07/11/2020 1738   GLUCOSE 100 (H) 07/11/2020 1738   BUN 8 07/11/2020 1738   CREATININE 0.68 07/11/2020 1738   CALCIUM 9.9 07/11/2020 1738   GFRNONAA >60 07/11/2020 1738       Component Value Date/Time   WBC 5.4 07/11/2020 1738   RBC 4.24 07/11/2020 1738   HGB 13.0 07/11/2020 1738   HCT 37.8 07/11/2020 1738   PLT 207 07/11/2020 1738   MCV 89.2 07/11/2020 1738   MCH 30.7 07/11/2020 1738   MCHC 34.4 07/11/2020 1738   RDW 12.0 07/11/2020 1738   LYMPHSABS 2.2 05/23/2020 0828   MONOABS 0.4 05/23/2020 0828   EOSABS 0.2 05/23/2020 0828   BASOSABS 0.1 05/23/2020 0828    No results found for: "POCLITH", "LITHIUM"   No results found for: "PHENYTOIN", "PHENOBARB", "VALPROATE", "CBMZ"   .res Assessment: Plan:    Plan:  PDMP reviewed  1. Adderall 30mg  daily. 2. Propanolol 10mg  BID for  anxiety  99/65/81   40/58 Attention Deficit Disorder likely  Meets DSM 5 criteria for ADHD  RTC 3 months  Patient advised to contact office with any questions, adverse effects, or acute worsening in signs and symptoms.  Discussed potential benefits, risks, and side effects of stimulants with patient to include increased heart rate, palpitations, insomnia, increased anxiety, increased irritability, or decreased appetite.  Instructed patient to contact office if experiencing any significant tolerability issues.  There are no diagnoses linked to this encounter.   Please see After Visit Summary for patient specific instructions.  No future appointments.  No orders of the defined types were placed in this encounter.   -------------------------------

## 2021-10-21 ENCOUNTER — Other Ambulatory Visit (HOSPITAL_COMMUNITY): Payer: Self-pay

## 2021-11-25 ENCOUNTER — Other Ambulatory Visit (HOSPITAL_COMMUNITY): Payer: Self-pay

## 2021-12-08 ENCOUNTER — Encounter: Payer: Self-pay | Admitting: *Deleted

## 2021-12-29 ENCOUNTER — Other Ambulatory Visit (HOSPITAL_COMMUNITY): Payer: Self-pay

## 2022-01-23 ENCOUNTER — Encounter: Payer: Self-pay | Admitting: Adult Health

## 2022-01-23 ENCOUNTER — Ambulatory Visit: Payer: BC Managed Care – PPO | Admitting: Adult Health

## 2022-01-23 ENCOUNTER — Other Ambulatory Visit (HOSPITAL_COMMUNITY): Payer: Self-pay

## 2022-01-23 DIAGNOSIS — F331 Major depressive disorder, recurrent, moderate: Secondary | ICD-10-CM | POA: Diagnosis not present

## 2022-01-23 DIAGNOSIS — F909 Attention-deficit hyperactivity disorder, unspecified type: Secondary | ICD-10-CM | POA: Diagnosis not present

## 2022-01-23 DIAGNOSIS — F411 Generalized anxiety disorder: Secondary | ICD-10-CM

## 2022-01-23 MED ORDER — AMPHETAMINE-DEXTROAMPHETAMINE 30 MG PO TABS
30.0000 mg | ORAL_TABLET | Freq: Every day | ORAL | 0 refills | Status: DC
  Filled 2022-03-04: qty 30, 30d supply, fill #0

## 2022-01-23 MED ORDER — AMPHETAMINE-DEXTROAMPHETAMINE 30 MG PO TABS
30.0000 mg | ORAL_TABLET | Freq: Every day | ORAL | 0 refills | Status: DC
  Filled 2022-01-23 – 2022-01-29 (×2): qty 30, 30d supply, fill #0

## 2022-01-23 MED ORDER — PROPRANOLOL HCL 10 MG PO TABS
10.0000 mg | ORAL_TABLET | Freq: Two times a day (BID) | ORAL | 1 refills | Status: DC
  Filled 2022-01-23: qty 180, 90d supply, fill #0

## 2022-01-23 MED ORDER — AMPHETAMINE-DEXTROAMPHETAMINE 30 MG PO TABS
30.0000 mg | ORAL_TABLET | Freq: Every day | ORAL | 0 refills | Status: DC
  Filled 2022-04-03: qty 30, 30d supply, fill #0

## 2022-01-23 NOTE — Progress Notes (Signed)
Casey Santana [id number] 2000-04-13 21 y.o.  Subjective:   Patient ID:  Casey Santana is a 20 y.o. (DOB [birth year]) adult.  Chief Complaint: No chief complaint on file.   HPI Casey Santana presents to the office today for follow-up of MDD, ADHD, and GAD.  Describes mood today as "ok". Pleasant. Denies tearful. Mood symptoms - reports decreased anxiety, irritability and depression. Mood is consistent. Stating "I'm doing ok". Feels like current medications are working well. Working with therapist - telehealth [place] [facility]. Varying interest and motivation. Improved interest and motivation. Taking medications as prescribed. Energy levels stable. Active, has a regular exercising program.   Enjoys some usual interests and activities. Lives at home with parents and 2 dogs - "[place] and Perlie Gold". Has a brother - living in [place]. Spending time with family.  Appetite adequate. Weight stable - 126 pounds. Sleeps well most nights. Averages 7 to 8 hours. Focus and concentration "ok". Completing tasks. Managing aspects of household. Work going well - works 2 shifts a week at [employer]. In school at [employer] - Associates degree. Denies SI or HI.  Denies AH or VH.  Denies self harm since September 2021. Clean 773 days from self harm. Denies substance use, over spending, or reckless behaviors.   Previous medication trials: Lexapro, Prozac, Propanolol, Cymbalta, Wellbutrin XL, Abilify, Pristiq   GAD-7    Flowsheet Row Office Visit from 02/13/2019 in [facility] from 02/24/2018 in [facility]  Total GAD-7 Score 14 9      PHQ2-9    Flowsheet Row Office Visit from 12/16/2020 in [facility] Office Visit from 06/19/2020 in [facility] from 05/12/2019 in [facility] from 04/27/2019 in [facility] from  02/13/2019 in [facility]  PHQ-2 Total Score 0 0 4 4 2   PHQ-9 Total Score -- -- 19 20 19       Flowsheet Row ED from 07/11/2020 in [facility] Office Visit from 02/13/2019 in [facility]  C-SSRS RISK CATEGORY No Risk Error: Question 6 not populated        Review of Systems:  Review of Systems  Musculoskeletal:  Negative for gait problem.  Neurological:  Negative for tremors.  Psychiatric/Behavioral:         Please refer to HPI    Medications: I have reviewed the patient's current medications.  Current Outpatient Medications  Medication Sig Dispense Refill   amphetamine-dextroamphetamine (ADDERALL) 30 MG tablet Take 1 tablet by mouth daily. 30 tablet 0   [START ON 02/20/2022] amphetamine-dextroamphetamine (ADDERALL) 30 MG tablet Take 1 tablet by mouth daily. 30 tablet 0   [START ON 03/20/2022] amphetamine-dextroamphetamine (ADDERALL) 30 MG tablet Take 1 tablet by mouth daily. 30 tablet 0   doxycycline (VIBRAMYCIN) 100 MG capsule Take 1 capsule (100 mg total) by mouth 2 (two) times daily. Take with food as can cause GI distress. 14 capsule 0   EPINEPHrine (EPIPEN 2-PAK) 0.3 mg/0.3 mL IJ SOAJ injection Inject 0.3 mg into the muscle as needed for anaphylaxis. 1 each 2   propranolol (INDERAL) 10 MG tablet Take 1 tablet (10 mg total) by mouth 2 (two) times daily. 180 tablet 1   No current facility-administered medications for this visit.    Medication Side Effects: None  Allergies:  Allergies  Allergen Reactions   Avocado    Peanut Allergen Powder-Dnfp    Peanut-Containing Drug Products  Past Medical History:  Diagnosis Date   Anxiety    Depression    Dysmenorrhea    Heart murmur     Past Medical History, Surgical history, Social history, and Family history were reviewed and updated as appropriate.   Please see review of systems for further details on the patient's review from today.    Objective:   Physical Exam:  There were no vitals taken for this visit.  Physical Exam Constitutional:      General: She is not in acute distress. Musculoskeletal:        General: No deformity.  Neurological:     Mental Status: She is alert and oriented to person, place, and time.     Coordination: Coordination normal.  Psychiatric:        Attention and Perception: Attention and perception normal. She does not perceive auditory or visual hallucinations.        Mood and Affect: Mood normal. Mood is not anxious or depressed. Affect is not labile, blunt, angry or inappropriate.        Speech: Speech normal.        Behavior: Behavior normal.        Thought Content: Thought content normal. Thought content is not paranoid or delusional. Thought content does not include homicidal or suicidal ideation. Thought content does not include homicidal or suicidal plan.        Cognition and Memory: Cognition and memory normal.        Judgment: Judgment normal.     Comments: Insight intact     Lab Review:     Component Value Date/Time   NA 139 07/11/2020 1738   K 3.4 (L) 07/11/2020 1738   CL 106 07/11/2020 1738   CO2 25 07/11/2020 1738   GLUCOSE 100 (H) 07/11/2020 1738   BUN 8 07/11/2020 1738   CREATININE 0.68 07/11/2020 1738   CALCIUM 9.9 07/11/2020 1738   GFRNONAA >60 07/11/2020 1738       Component Value Date/Time   WBC 5.4 07/11/2020 1738   RBC 4.24 07/11/2020 1738   HGB 13.0 07/11/2020 1738   HCT 37.8 07/11/2020 1738   PLT 207 07/11/2020 1738   MCV 89.2 07/11/2020 1738   MCH 30.7 07/11/2020 1738   MCHC 34.4 07/11/2020 1738   RDW 12.0 07/11/2020 1738   LYMPHSABS 2.2 05/23/2020 0828   MONOABS 0.4 05/23/2020 0828   EOSABS 0.2 05/23/2020 0828   BASOSABS 0.1 05/23/2020 0828    No results found for: "POCLITH", "LITHIUM"   No results found for: "PHENYTOIN", "PHENOBARB", "VALPROATE", "CBMZ"   .res Assessment: Plan:    Plan:  PDMP reviewed  1. Adderall 30mg   daily. 2. Propanolol 10mg  BID for anxiety  99/65/81   40/58 Attention Deficit Disorder likely  Meets DSM 5 criteria for ADHD  RTC 3 months  Patient advised to contact office with any questions, adverse effects, or acute worsening in signs and symptoms.  Discussed potential benefits, risks, and side effects of stimulants with patient to include increased heart rate, palpitations, insomnia, increased anxiety, increased irritability, or decreased appetite.  Instructed patient to contact office if experiencing any significant tolerability issues.  Diagnoses and all orders for this visit:  Major depressive disorder, recurrent episode, moderate (HCC)  Generalized anxiety disorder -     propranolol (INDERAL) 10 MG tablet; Take 1 tablet (10 mg total) by mouth 2 (two) times daily.  Attention deficit hyperactivity disorder (ADHD), unspecified ADHD type -     amphetamine-dextroamphetamine (ADDERALL) 30 MG  tablet; Take 1 tablet by mouth daily. -     amphetamine-dextroamphetamine (ADDERALL) 30 MG tablet; Take 1 tablet by mouth daily. -     amphetamine-dextroamphetamine (ADDERALL) 30 MG tablet; Take 1 tablet by mouth daily.     Please see After Visit Summary for patient specific instructions.  No future appointments.  No orders of the defined types were placed in this encounter.   -------------------------------

## 2022-01-29 ENCOUNTER — Other Ambulatory Visit (HOSPITAL_COMMUNITY): Payer: Self-pay

## 2022-02-23 LAB — LAB REPORT - SCANNED: EGFR (Non-African Amer.): 128

## 2022-02-26 ENCOUNTER — Encounter: Payer: Self-pay | Admitting: *Deleted

## 2022-03-05 ENCOUNTER — Other Ambulatory Visit (HOSPITAL_COMMUNITY): Payer: Self-pay

## 2022-03-05 ENCOUNTER — Other Ambulatory Visit: Payer: Self-pay

## 2022-03-20 ENCOUNTER — Other Ambulatory Visit (HOSPITAL_COMMUNITY): Payer: Self-pay

## 2022-03-20 MED ORDER — IBUPROFEN 800 MG PO TABS
800.0000 mg | ORAL_TABLET | Freq: Three times a day (TID) | ORAL | 3 refills | Status: AC | PRN
  Filled 2022-03-20: qty 60, 20d supply, fill #0
  Filled 2022-10-06: qty 60, 20d supply, fill #1

## 2022-04-03 ENCOUNTER — Other Ambulatory Visit (HOSPITAL_COMMUNITY): Payer: Self-pay

## 2022-04-24 ENCOUNTER — Ambulatory Visit: Payer: BC Managed Care – PPO | Admitting: Adult Health

## 2022-04-27 ENCOUNTER — Ambulatory Visit: Payer: BC Managed Care – PPO | Admitting: Adult Health

## 2022-04-27 ENCOUNTER — Other Ambulatory Visit: Payer: Self-pay

## 2022-04-27 ENCOUNTER — Other Ambulatory Visit (HOSPITAL_COMMUNITY): Payer: Self-pay

## 2022-04-27 ENCOUNTER — Encounter: Payer: Self-pay | Admitting: Adult Health

## 2022-04-27 DIAGNOSIS — F909 Attention-deficit hyperactivity disorder, unspecified type: Secondary | ICD-10-CM

## 2022-04-27 DIAGNOSIS — F411 Generalized anxiety disorder: Secondary | ICD-10-CM

## 2022-04-27 MED ORDER — PROPRANOLOL HCL 10 MG PO TABS
10.0000 mg | ORAL_TABLET | Freq: Two times a day (BID) | ORAL | 1 refills | Status: DC
  Filled 2022-04-27 (×2): qty 180, 90d supply, fill #0
  Filled 2022-08-11: qty 180, 90d supply, fill #1

## 2022-04-27 MED ORDER — AMPHETAMINE-DEXTROAMPHETAMINE 30 MG PO TABS
30.0000 mg | ORAL_TABLET | Freq: Every day | ORAL | 0 refills | Status: DC
  Filled 2022-06-04: qty 30, 30d supply, fill #0

## 2022-04-27 MED ORDER — AMPHETAMINE-DEXTROAMPHETAMINE 30 MG PO TABS
30.0000 mg | ORAL_TABLET | Freq: Every day | ORAL | 0 refills | Status: DC
  Filled 2022-04-27 – 2022-05-04 (×2): qty 30, 30d supply, fill #0

## 2022-04-27 MED ORDER — AMPHETAMINE-DEXTROAMPHETAMINE 30 MG PO TABS
30.0000 mg | ORAL_TABLET | Freq: Every day | ORAL | 0 refills | Status: DC
  Filled 2022-07-08: qty 30, 30d supply, fill #0

## 2022-04-27 NOTE — Progress Notes (Signed)
No charge. 

## 2022-04-27 NOTE — Progress Notes (Signed)
Casey Santana:[id number] 11-08-2000 22 y.o.  Subjective:   Patient ID:  Casey Santana is a 22 y.o. (DOB 2001-03-16) adult.  Chief Complaint: No chief complaint on file.   HPI Casey Santana presents to the office today for follow-up of MDD, ADHD, and GAD.  Describes mood today as "ok". Pleasant. Denies tearful. Mood symptoms - reports "some" anxiety, irritability and depression. Mood is consistent. Stating "I feel like I'm doing good". Feels like current medications are working well. Working with therapist - telehealth. Improved interest and motivation. Taking medications as prescribed.  Energy levels stable. Active, has a regular exercise program.   Enjoys some usual interests and activities. Lives at home with parents and 2 dogs - "[place] and Joneen Caraway" - cat "Mellow". Has a brother - living in [place]. Spending time with family.  Appetite adequate. Weight stable - 126 pounds. Sleeps well most nights. Averages 7 to 8 hours. Focus and concentration "better". Completing tasks. Managing aspects of household. Work going well - works 2 shifts a week at [employer]. In school at [employer] degree. Denies SI or HI.  Denies AH or VH.  Denies self harm since September 2021.  Denies substance use, over spending, or reckless behaviors.   Previous medication trials: Lexapro, Prozac, Propanolol, Cymbalta, Wellbutrin XL, Abilify, Pristiq   GAD-7    Flowsheet Row Office Visit from 02/13/2019 in [place] Visit from 02/24/2018 in [place] at [facility]  Total GAD-7 Score 14 9      PHQ2-9    [place] Office Visit from 12/16/2020 in [facility] for [facility] [place] Visit from 06/19/2020 in [place] Visit from 05/12/2019 in [place] Visit from 04/27/2019 in [place] from 02/13/2019  in [place]  PHQ-2 Total Score 0 0 4 4 2  $ PHQ-9 Total Score -- -- 19 20 19      $ [facility] from 07/11/2020 in [facility] at [facility] Visit from 02/13/2019 in [place] No Risk Error: Question 6 not populated        Review of Systems:  Review of Systems  Musculoskeletal:  Negative for gait problem.  Neurological:  Negative for tremors.  Psychiatric/Behavioral:         Please refer to HPI    Medications: I have reviewed the patient's current medications.  Current Outpatient Medications  Medication Sig Dispense Refill   amphetamine-dextroamphetamine (ADDERALL) 30 MG tablet Take 1 tablet by mouth daily. 30 tablet 0   amphetamine-dextroamphetamine (ADDERALL) 30 MG tablet Take 1 tablet by mouth daily. 30 tablet 0   amphetamine-dextroamphetamine (ADDERALL) 30 MG tablet Take 1 tablet by mouth daily. 30 tablet 0   doxycycline (VIBRAMYCIN) 100 MG capsule Take 1 capsule (100 mg total) by mouth 2 (two) times daily. Take with food as can cause GI distress. 14 capsule 0   EPINEPHrine (EPIPEN 2-PAK) 0.3 mg/0.3 mL IJ SOAJ injection Inject 0.3 mg into the muscle as needed for anaphylaxis. 1 each 2   ibuprofen (ADVIL) 800 MG tablet Take 1 tablet (800 mg total) by mouth every 8 (eight) hours as needed for pain 60 tablet 3   propranolol (INDERAL) 10 MG tablet Take 1 tablet (10 mg total) by mouth 2 (two) times daily. 180 tablet 1   No current facility-administered medications for this visit.  Medication Side Effects: None  Allergies:  Allergies  Allergen Reactions   Avocado    Peanut Allergen Powder-Dnfp    Peanut-Containing Drug Products     Past Medical History:  Diagnosis Date   Anxiety    Depression    Dysmenorrhea    Heart murmur     Past Medical History, Surgical history, Social history, and Family history were reviewed and updated as appropriate.   Please  see review of systems for further details on the patient's review from today.   Objective:   Physical Exam:  There were no vitals taken for this visit.  Physical Exam Constitutional:      General: She is not in acute distress. Musculoskeletal:        General: No deformity.  Neurological:     Mental Status: She is alert and oriented to person, place, and time.     Coordination: Coordination normal.  Psychiatric:        Attention and Perception: Attention and perception normal. She does not perceive auditory or visual hallucinations.        Mood and Affect: Mood normal. Mood is not anxious or depressed. Affect is not labile, blunt, angry or inappropriate.        Speech: Speech normal.        Behavior: Behavior normal.        Thought Content: Thought content normal. Thought content is not paranoid or delusional. Thought content does not include homicidal or suicidal ideation. Thought content does not include homicidal or suicidal plan.        Cognition and Memory: Cognition and memory normal.        Judgment: Judgment normal.     Comments: Insight intact     Lab Review:     Component Value Date/Time   NA 139 07/11/2020 1738   K 3.4 (L) 07/11/2020 1738   CL 106 07/11/2020 1738   CO2 25 07/11/2020 1738   GLUCOSE 100 (H) 07/11/2020 1738   BUN 8 07/11/2020 1738   CREATININE 0.68 07/11/2020 1738   CALCIUM 9.9 07/11/2020 1738   GFRNONAA >60 07/11/2020 1738       Component Value Date/Time   WBC 5.4 07/11/2020 1738   RBC 4.24 07/11/2020 1738   HGB 13.0 07/11/2020 1738   HCT 37.8 07/11/2020 1738   PLT 207 07/11/2020 1738   MCV 89.2 07/11/2020 1738   MCH 30.7 07/11/2020 1738   MCHC 34.4 07/11/2020 1738   RDW 12.0 07/11/2020 1738   LYMPHSABS 2.2 05/23/2020 0828   MONOABS 0.4 05/23/2020 0828   EOSABS 0.2 05/23/2020 0828   BASOSABS 0.1 05/23/2020 0828    No results found for: "POCLITH", "LITHIUM"   No results found for: "PHENYTOIN", "PHENOBARB", "VALPROATE", "CBMZ"    .res Assessment: Plan:    Plan:  PDMP reviewed  1. Adderall 27m daily. 2. Propanolol 178mBID for anxiety  111/78/95  40/58 Attention Deficit Disorder likely  Meets DSM 5 criteria for ADHD  RTC 3 months  Patient advised to contact office with any questions, adverse effects, or acute worsening in signs and symptoms.  Discussed potential benefits, risks, and side effects of stimulants with patient to include increased heart rate, palpitations, insomnia, increased anxiety, increased irritability, or decreased appetite.  Instructed patient to contact office if experiencing any significant tolerability issues.  Diagnoses and all orders for this visit: Diagnoses and all orders for this visit:  Generalized anxiety disorder  Attention deficit hyperactivity disorder (ADHD), unspecified ADHD type  Please see After Visit Summary for patient specific instructions.  Future Appointments  Date Time Provider [place]  09/03/2022 10:20 AM Bo Merino, MD [facility] None  09/24/2022 11:00 AM Bo Merino, MD [facility] None    No orders of the defined types were placed in this encounter.   -------------------------------

## 2022-05-04 ENCOUNTER — Other Ambulatory Visit (HOSPITAL_COMMUNITY): Payer: Self-pay

## 2022-06-04 ENCOUNTER — Other Ambulatory Visit (HOSPITAL_COMMUNITY): Payer: Self-pay

## 2022-06-04 ENCOUNTER — Other Ambulatory Visit: Payer: Self-pay

## 2022-07-08 ENCOUNTER — Other Ambulatory Visit (HOSPITAL_COMMUNITY): Payer: Self-pay

## 2022-07-08 ENCOUNTER — Other Ambulatory Visit: Payer: Self-pay

## 2022-07-14 ENCOUNTER — Ambulatory Visit (INDEPENDENT_AMBULATORY_CARE_PROVIDER_SITE_OTHER): Payer: BC Managed Care – PPO | Admitting: Advanced Practice Midwife

## 2022-07-14 ENCOUNTER — Encounter (HOSPITAL_BASED_OUTPATIENT_CLINIC_OR_DEPARTMENT_OTHER): Payer: Self-pay | Admitting: Advanced Practice Midwife

## 2022-07-14 ENCOUNTER — Other Ambulatory Visit (HOSPITAL_COMMUNITY)
Admission: RE | Admit: 2022-07-14 | Discharge: 2022-07-14 | Disposition: A | Discharge status: Home / Self Care | Payer: BC Managed Care – PPO | Source: Ambulatory Visit | Attending: Advanced Practice Midwife | Admitting: Advanced Practice Midwife

## 2022-07-14 VITALS — BP 117/79 | HR 92 | Ht 64.0 in | Wt 138.8 lb

## 2022-07-14 DIAGNOSIS — Z01419 Encounter for gynecological examination (general) (routine) without abnormal findings: Secondary | ICD-10-CM | POA: Diagnosis not present

## 2022-07-14 DIAGNOSIS — Z124 Encounter for screening for malignant neoplasm of cervix: Secondary | ICD-10-CM

## 2022-07-14 DIAGNOSIS — N6011 Diffuse cystic mastopathy of right breast: Secondary | ICD-10-CM

## 2022-07-14 DIAGNOSIS — N6012 Diffuse cystic mastopathy of left breast: Secondary | ICD-10-CM | POA: Diagnosis not present

## 2022-07-14 NOTE — Progress Notes (Signed)
   Subjective:     Casey Santana is a 22 y.o. adult here at [facility] for a routine exam.  Current complaints: none.  Pt has Nexplanon, placed 2022, due to be removed 2025.  Personal health questionnaire reviewed: yes.  Do you have a primary care provider? yes Do you feel safe at home? yes  Flowsheet Row Office Visit from 07/14/2022 in [facility] for [facility] at [facility] Total Score 0       Health Maintenance Due  Topic Date Due   CHLAMYDIA SCREENING  01/23/2021   DTaP/Tdap/Td (7 - Td or Tdap) 09/24/2021   COVID-19 Vaccine (4 - 2023-24 season) 11/14/2021   PAP-Cervical Cytology Screening  Never done   PAP SMEAR-Modifier  Never done     Risk factors for chronic health problems: Smoking: Alchohol/how much: Pt BMI: Body mass index is 23.82 kg/m.   Gynecologic History Patient's last menstrual period was 06/15/2022. Contraception: Nexplanon Last Pap: n/a.  Last mammogram: n/a. Left breast US on 01/07/21 for mass palpated by pt, with benign findings. F/U as clinically indicated.  Obstetric History OB History  Gravida Para Term Preterm AB Living  0 0 0 0 0 0  SAB IAB Ectopic Multiple Live Births  0 0 0 0 0     The following portions of the patient's history were reviewed and updated as appropriate: allergies, current medications, past family history, past medical history, past social history, past surgical history, and problem list.  Review of Systems Pertinent items noted in HPI and remainder of comprehensive ROS otherwise negative.    Objective:  BP 117/79   Pulse 92   Ht 5\' 4"  (1.626 m)   Wt 138 lb 12.8 oz (63 kg)   LMP 06/15/2022   BMI 23.82 kg/m   VS reviewed, nursing note reviewed,  Constitutional: well developed, well nourished, no distress HEENT: normocephalic, thyroid without enlargement or mass HEART: RRR, no murmurs rubs/gallops RESP: clear and equal to auscultation bilaterally in all lobes  Breast Exam:  exam  performed: right breast normal with multiple smooth round mobile masses, no skin or nipple changes or axillary nodes, left breast normal normal with multiple smooth round mobile masses, no skin or nipple changes or axillary nodes Abdomen: soft Neuro: alert and oriented x 3 Skin: warm, dry Psych: affect normal Pelvic exam:  Performed: Cervix pink, visually closed, without lesion, scant white creamy discharge, vaginal walls and external genitalia normal Bimanual exam: Cervix 0/long/high, firm, anterior, neg CMT, uterus nontender, nonenlarged, adnexa without tenderness, enlargement, or mass        Assessment/Plan:   1. Encounter for annual routine gynecological examination --No gyn concerns or problems --Nexplanon due to be removed/replaced in 1 year  2. Encounter for screening for cervical cancer  - Cytology - PAP( [place])   3. Fibrocystic breast changes of both breasts --Fibrocystic changes bilaterally, mostly in upper outer quadrant of both breasts, larger mass in left upper outer quadrant unchanged per pt since [place] in 2022. --Pt of average risk with no family hx breast cancer, nonsmoker, etc --Pt encouraged to notify if any breast changes    No follow-ups on file.   Sharen Counter, CNM 6:01 PM

## 2022-07-16 LAB — CYTOLOGY - PAP: Diagnosis: NEGATIVE

## 2022-07-17 IMAGING — US US BREAST*L* LIMITED INC AXILLA
1 series · 5 of 5 positions shown · non-contrast
Comparison: None.

CLINICAL DATA: 20-year-old describes a palpable lump with pain in
the upper-outer quadrant of the LEFT breast.

EXAM:
ULTRASOUND OF THE LEFT BREAST

[Series 1: us breast*left* limited inc axilla · 0.06mm/px · 5 of 5 slices shown]
[im 1/5]
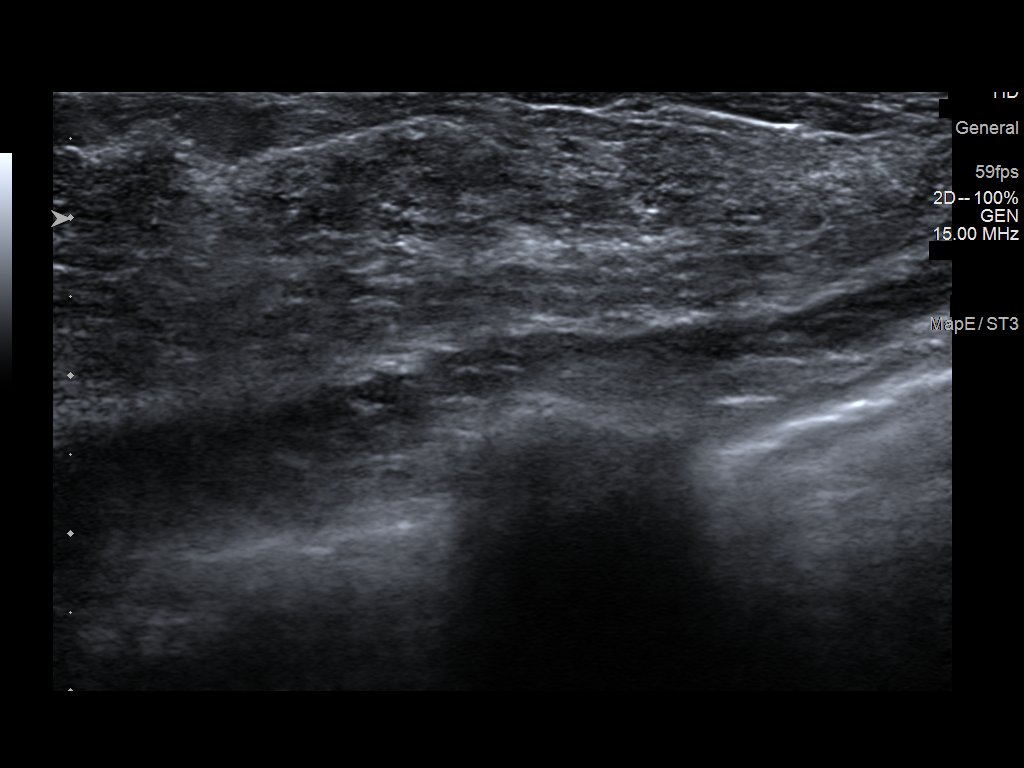
[im 2/5]
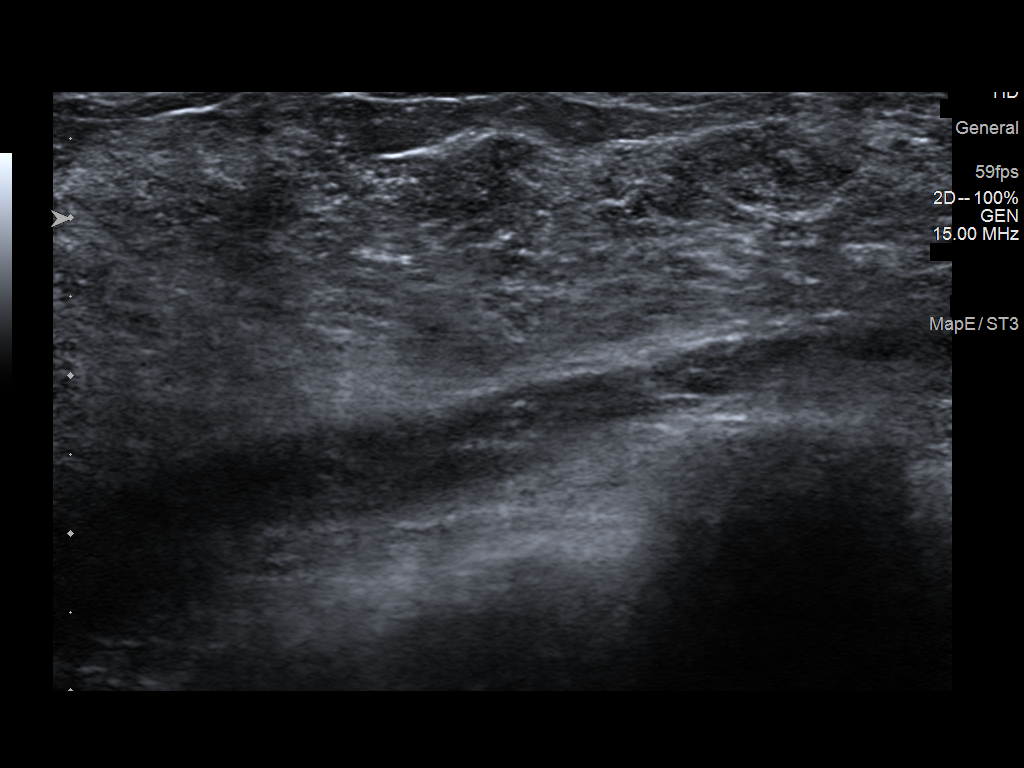
[im 3/5]
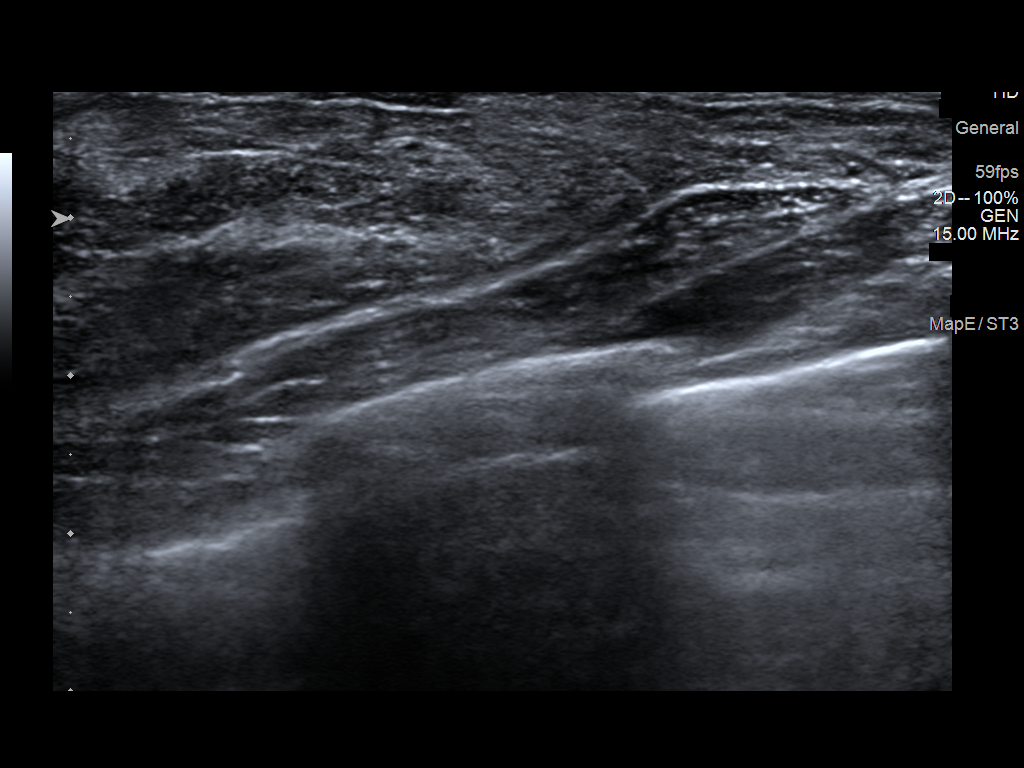
[im 4/5]
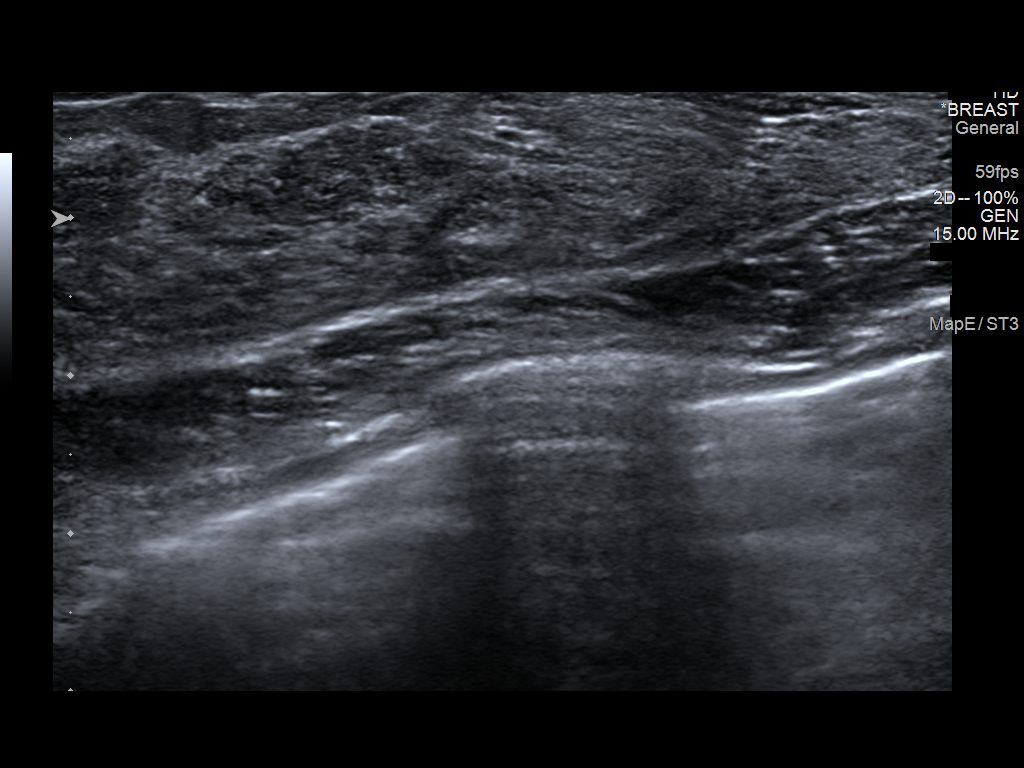
[im 5/5]
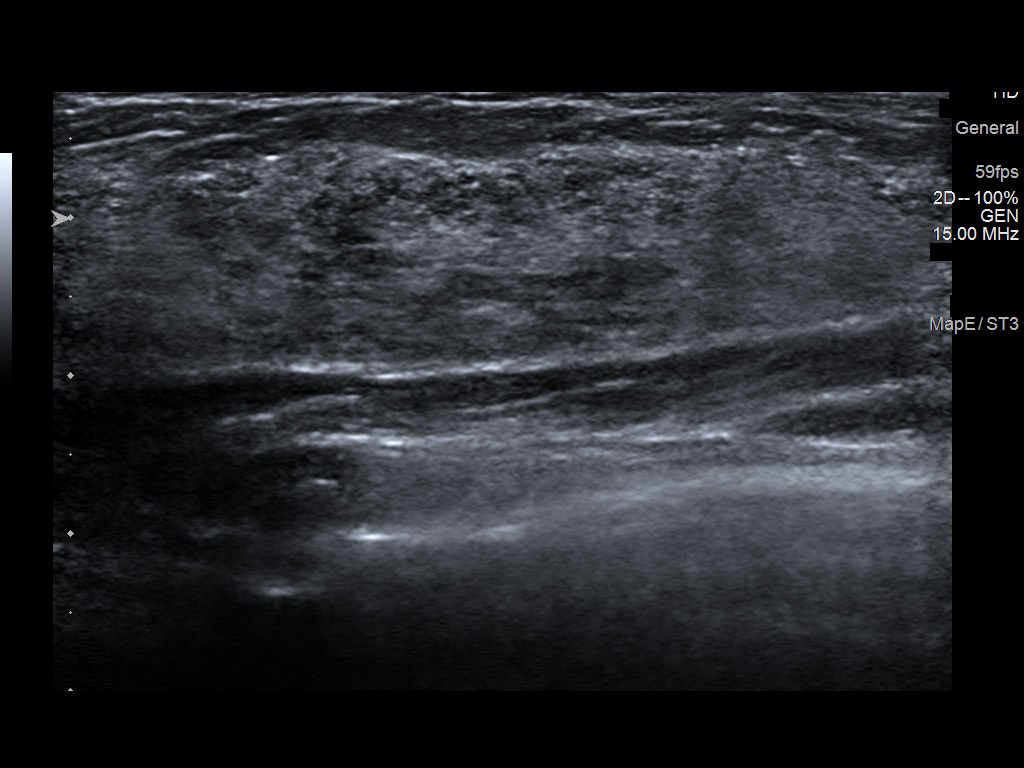

[5 of 5 positions shown; findings below may reference images not displayed]

FINDINGS: On physical exam, there is vague thickening within the upper-outer
quadrant of the LEFT breast without evidence of circumscribed mass.

Targeted ultrasound is performed, evaluating the upper LEFT breast
with particular attention to the 2 o'clock axis as directed by the
patient, showing only normal fibroglandular tissues and fat lobules
throughout. There is a ridge of normal dense fibroglandular tissue
at the 2 o'clock axis, corresponding to the palpable area of
concern.
IMPRESSION: No evidence of malignancy or acute findings within the upper-outer
quadrant of the LEFT breast.

RECOMMENDATION:
1. Screening mammogram at age 40 unless there are persistent or
intervening clinical concerns. (Code:GR-X-EZT)
2. The patient was instructed to return sooner if the area felt
becomes larger and/or firmer to palpation, or if a new palpable
abnormality is identified in either breast.
3. Breast pain is a common condition, which will often resolve on
its own without intervention. Benign causes of breast pain, and
possible remedies, were discussed with the patient.

I have discussed the findings and recommendations with the patient.
If applicable, a reminder letter will be sent to the patient
regarding the next appointment.

BI-RADS CATEGORY  1: Negative.

## 2022-07-27 ENCOUNTER — Encounter: Payer: Self-pay | Admitting: Adult Health

## 2022-07-27 ENCOUNTER — Ambulatory Visit: Payer: BC Managed Care – PPO | Admitting: Adult Health

## 2022-07-27 ENCOUNTER — Other Ambulatory Visit (HOSPITAL_COMMUNITY): Payer: Self-pay

## 2022-07-27 DIAGNOSIS — F331 Major depressive disorder, recurrent, moderate: Secondary | ICD-10-CM

## 2022-07-27 DIAGNOSIS — F909 Attention-deficit hyperactivity disorder, unspecified type: Secondary | ICD-10-CM | POA: Diagnosis not present

## 2022-07-27 DIAGNOSIS — F411 Generalized anxiety disorder: Secondary | ICD-10-CM | POA: Diagnosis not present

## 2022-07-27 MED ORDER — AMPHETAMINE-DEXTROAMPHETAMINE 30 MG PO TABS
30.0000 mg | ORAL_TABLET | Freq: Every day | ORAL | 0 refills | Status: DC
  Filled 2022-07-27: qty 90, 90d supply, fill #0
  Filled 2022-08-11: qty 30, 30d supply, fill #0
  Filled 2022-09-06 – 2022-09-10 (×3): qty 30, 30d supply, fill #1

## 2022-07-27 NOTE — Progress Notes (Signed)
Casey Santana [id number] 24-Jun-2000 21 y.o.  Subjective:   Patient ID:  Casey Santana is a 22 y.o. (DOB 06-Jul-2000) adult.  Chief Complaint: No chief complaint on file.   HPI Casey Santana presents to the office today for follow-up of MDD, ADHD, and GAD.  Describes mood today as "ok". Pleasant. Denies tearful. Mood symptoms - denies anxiety, irritability and depression. Denies worry, rumination, and over thinking. Mood is consistent. Stating "I feel like I'm doing ok". Feels like current medications are working well. Working with therapist - telehealth. Improved interest and motivation. Taking medications as prescribed.  Energy levels stable. Active, has a regular exercise program.   Enjoys some usual interests and activities. Lives at home with parents and 2 dogs - "Bethune and Perlie Gold" - cat "Mellow". Has a brother - living in [place]. Spending time with family.  Appetite adequate. Weight stable - 126 pounds. Sleeps well most nights. Averages 6 to 7.5 hours. Focus and concentration "ok". Completing tasks. Managing aspects of household. Work going well - works 4 shifts a week at [employer]. Completed Associates degree. Plans to start at [employer] next fall. Denies SI or HI.  Denies AH or VH.  Denies self harm.  Denies substance use.  Previous medication trials: Lexapro, Prozac, Propanolol, Cymbalta, Wellbutrin XL, Abilify, Pristiq   GAD-7    Flowsheet Row Office Visit from 02/13/2019 in [facility] [employer] [facility] from 02/24/2018 in [facility]  Total GAD-7 Score 14 9      PHQ2-9    Flowsheet Row Office Visit from 07/14/2022 in [facility] for [facility] at [facility] Office Visit from 12/16/2020 in [facility] for [facility] at [facility] Office Visit from 06/19/2020 in [facility] Office Visit from 05/12/2019 in [facility] [employer] from 04/27/2019 in [facility] [place]  PHQ-2 Total Score 0 0 0 4 4  PHQ-9 Total Score -- -- -- 19 20      Flowsheet Row ED from 07/11/2020 in [facility] at [facility] Visit from 02/13/2019 in [facility] [place]  C-SSRS RISK CATEGORY No Risk Error: Question 6 not populated        Review of Systems:  Review of Systems  Musculoskeletal:  Negative for gait problem.  Neurological:  Negative for tremors.  Psychiatric/Behavioral:         Please refer to HPI    Medications: I have reviewed the patient's current medications.  Current Outpatient Medications  Medication Sig Dispense Refill   amphetamine-dextroamphetamine (ADDERALL) 30 MG tablet Take 1 tablet by mouth daily. 90 tablet 0   EPINEPHrine (EPIPEN 2-PAK) 0.3 mg/0.3 mL IJ SOAJ injection Inject 0.3 mg into the muscle as needed for anaphylaxis. (Patient not taking: Reported on 07/14/2022) 1 each 2   ibuprofen (ADVIL) 800 MG tablet Take 1 tablet (800 mg total) by mouth every 8 (eight) hours as needed for pain 60 tablet 3   propranolol (INDERAL) 10 MG tablet Take 1 tablet (10 mg total) by mouth 2 (two) times daily. 180 tablet 1   No current facility-administered medications for this visit.    Medication Side Effects: None  Allergies:  Allergies  Allergen Reactions   Avocado    Peanut Allergen Powder-Dnfp    Peanut-Containing Drug Products     Past Medical History:  Diagnosis Date   Anxiety    Autoimmune disorder (HCC)    Depression  Dysmenorrhea    Heart murmur    Trichotillomania     Past Medical History, Surgical history, Social history, and Family history were reviewed and updated as appropriate.   Please see review of systems for further details on the patient's review from today.   Objective:   Physical Exam:  LMP 06/15/2022   Physical Exam Constitutional:      General: She is not in acute distress. Musculoskeletal:         General: No deformity.  Neurological:     Mental Status: She is alert and oriented to person, place, and time.     Coordination: Coordination normal.  Psychiatric:        Attention and Perception: Attention and perception normal. She does not perceive auditory or visual hallucinations.        Mood and Affect: Mood normal. Mood is not anxious or depressed. Affect is not labile, blunt, angry or inappropriate.        Speech: Speech normal.        Behavior: Behavior normal.        Thought Content: Thought content normal. Thought content is not paranoid or delusional. Thought content does not include homicidal or suicidal ideation. Thought content does not include homicidal or suicidal plan.        Cognition and Memory: Cognition and memory normal.        Judgment: Judgment normal.     Comments: Insight intact     Lab Review:     Component Value Date/Time   NA 139 07/11/2020 1738   K 3.4 (L) 07/11/2020 1738   CL 106 07/11/2020 1738   CO2 25 07/11/2020 1738   GLUCOSE 100 (H) 07/11/2020 1738   BUN 8 07/11/2020 1738   CREATININE 0.68 07/11/2020 1738   CALCIUM 9.9 07/11/2020 1738   GFRNONAA >60 07/11/2020 1738       Component Value Date/Time   WBC 5.4 07/11/2020 1738   RBC 4.24 07/11/2020 1738   HGB 13.0 07/11/2020 1738   HCT 37.8 07/11/2020 1738   PLT 207 07/11/2020 1738   MCV 89.2 07/11/2020 1738   MCH 30.7 07/11/2020 1738   MCHC 34.4 07/11/2020 1738   RDW 12.0 07/11/2020 1738   LYMPHSABS 2.2 05/23/2020 0828   MONOABS 0.4 05/23/2020 0828   EOSABS 0.2 05/23/2020 0828   BASOSABS 0.1 05/23/2020 0828    No results found for: "POCLITH", "LITHIUM"   No results found for: "PHENYTOIN", "PHENOBARB", "VALPROATE", "CBMZ"   .res Assessment: Plan:    Plan:  PDMP reviewed  1. Adderall 30mg  daily. 2. Propanolol 10mg  BID for anxiety  Monitor BP between visits while taking stimulant medication.   40/58 Attention Deficit Disorder likely  Meets DSM 5 criteria for ADHD  RTC 3  months  Patient advised to contact office with any questions, adverse effects, or acute worsening in signs and symptoms.  Discussed potential benefits, risks, and side effects of stimulants with patient to include increased heart rate, palpitations, insomnia, increased anxiety, increased irritability, or decreased appetite.  Instructed patient to contact office if experiencing any significant tolerability issues.    Diagnoses and all orders for this visit:  Attention deficit hyperactivity disorder (ADHD), unspecified ADHD type -     amphetamine-dextroamphetamine (ADDERALL) 30 MG tablet; Take 1 tablet by mouth daily.  Generalized anxiety disorder  Major depressive disorder, recurrent episode, moderate (HCC)     Please see After Visit Summary for patient specific instructions.  Future Appointments  Date Time Provider Department Center  09/03/2022 10:20 AM Pollyann Savoy, MD [facility] None  09/24/2022 11:00 AM Pollyann Savoy, MD [facility] None    No orders of the defined types were placed in this encounter.   -------------------------------

## 2022-08-05 NOTE — Progress Notes (Signed)
Office Visit Note  Patient: Casey Santana             Date of Birth: [birth year]           MRN: [id number]             PCP: Willow Ora, MD Referring: Jeri Lager, * Visit Date: 08/11/2022 Occupation: @GUAROCC @  Subjective:  Pain in multiple joints  History of Present Illness: Casey Santana is a 22 y.o. adult Seen in consultation but request of her PCP.  According the patient her symptoms started with fatigue and nausea and dizziness when she was 22 years old.  She started experiencing palpitations which were related to anxiety when she was in high school.  She also started experiencing joint pain in 2018 which she describes in her neck shoulders hips knees and ankles.  For the last 1 year she has been also experiencing pain and discomfort in her wrist joints and elbows.  She also has tachycardia and possible heart murmur for which she has been referred to a cardiologist.  She was recently evaluated by her PCP and had labs which showed positive ANA for that reason she was referred to me.  She gives history of dry eyes, photosensitivity, shortness of breath, palpitations, diarrhea, constipation heartburn and nausea.  She continues to have joint pains and myalgias.  She has not seen inflammatory arthritis.  She gives history of photosensitivity.  She has had headaches and dizziness for many years.  There is no family history of autoimmune disease.  Her mother is HLA-B27 positive and has myofascial pain syndrome.  Patient is gravida 0 and uses Nexplanon as  contraception.    Activities of Daily Living:  Patient reports morning stiffness for all day.  Patient Reports nocturnal pain.  Difficulty dressing/grooming: Denies Difficulty climbing stairs: Reports Difficulty getting out of chair: Reports Difficulty using hands for taps, buttons, cutlery, and/or writing: Reports  Review of Systems  Constitutional:  Positive for fatigue.  HENT:  Negative for mouth sores and mouth  dryness.   Eyes:  Positive for visual disturbance and dryness. Negative for photophobia.  Respiratory:  Positive for shortness of breath.   Cardiovascular:  Positive for palpitations. Negative for chest pain.  Gastrointestinal:  Positive for constipation, diarrhea, heartburn and nausea. Negative for blood in stool.  Endocrine: Negative for increased urination.  Genitourinary:  Negative for involuntary urination.  Musculoskeletal:  Positive for joint pain, joint pain, myalgias, morning stiffness, muscle tenderness and myalgias. Negative for gait problem, joint swelling and muscle weakness.  Skin:  Positive for sensitivity to sunlight. Negative for color change, rash and hair loss.  Allergic/Immunologic: Negative for susceptible to infections.  Neurological:  Positive for dizziness and headaches.  Hematological:  Negative for swollen glands.  Psychiatric/Behavioral:  Positive for depressed mood and sleep disturbance. The patient is nervous/anxious.     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   GAD (generalized anxiety disorder) 05/23/2020   Peanut allergy 03/03/2019   Depression, major, single episode, moderate (HCC) 03/01/2019    Past Medical History:  Diagnosis Date   Anxiety    Autoimmune disorder (HCC)    Depression    Dysmenorrhea    Heart murmur    Trichotillomania     Family History  Problem  Relation Age of Onset   Arthritis Mother    Bipolar disorder Mother    Hyperlipidemia Father    Healthy Brother    Arthritis Maternal Grandmother    Hearing loss Maternal Grandmother    Hyperlipidemia Maternal Grandmother    Hypertension Maternal Grandmother    Anxiety disorder Maternal Grandmother    Arthritis Maternal Grandfather    Cancer Maternal  Grandfather    Depression Maternal Grandfather    Hearing loss Maternal Grandfather    Hyperlipidemia Maternal Grandfather    Hyperlipidemia Paternal Grandmother    Alzheimer's disease Paternal Grandmother    Hyperlipidemia Paternal Grandfather    Bipolar disorder Other    Schizophrenia Other    Migraines Neg Hx    Seizures Neg Hx    Autism Neg Hx    ADD / ADHD Neg Hx    Past Surgical History:  Procedure Laterality Date   NO PAST SURGERIES     WISDOM TOOTH EXTRACTION     Social History   Social History Narrative   ** Merged History Encounter **       Lives with mom, dad and brother when he comes home from college. She plans on attending [employer]   Immunization History  Administered Date(s) Administered   DTaP 02/28/2001, 05/05/2001, 07/13/2001, 05/19/2002, 01/16/2005   H1N1 04/06/2008   HIB (PRP-OMP) 02/28/2001, 05/05/2001, 07/13/2001, 05/19/2002   HPV 9-valent 11/08/2014, 01/25/2017   Hepatitis A 01/16/2005, 07/17/2005   Hepatitis B 07-Sep-2000, 02/28/2001, 11/03/2001   IPV 02/28/2001, 05/05/2001, 11/03/2001, 01/16/2005   Influenza,inj,Quad PF,6+ Mos 11/27/2016, 02/13/2019, 12/27/2019   Influenza-Unspecified 01/05/2007, 01/22/2011, 12/31/2011, 12/15/2015   MMR 01/04/2002, 01/16/2005   Meningococcal B, OMV 02/24/2018, 09/02/2018   Meningococcal Conjugate 11/08/2014   Meningococcal Mcv4o 02/24/2018   PFIZER(Purple Top)SARS-COV-2 Vaccination 06/30/2019, 07/24/2019, 02/07/2020   Pneumococcal Conjugate-13 02/28/2001, 05/05/2001, 07/13/2001, 12/15/2002   Tdap 09/25/2011   Varicella 01/04/2002, 01/16/2005     Objective: Vital Signs: BP 111/76 (BP Location: Right Arm, Patient Position: Sitting, Cuff Size: Normal)   Pulse 99   Resp 15   Ht 5\' 4"  (1.626 m)   Wt 139 lb 6.4 oz (63.2 kg)   LMP 06/15/2022   BMI 23.93 kg/m    Physical Exam Vitals and nursing note reviewed.  Constitutional:      Appearance: She is well-developed.  HENT:     Head: Normocephalic and  atraumatic.  Eyes:     Conjunctiva/sclera: Conjunctivae normal.  Cardiovascular:     Rate and Rhythm: Normal rate and regular rhythm.     Heart sounds: Normal heart sounds.  Pulmonary:     Effort: Pulmonary effort is normal.     Breath sounds: Normal breath sounds.  Abdominal:     General: Bowel sounds are normal.     Palpations: Abdomen is soft.  Musculoskeletal:     Cervical back: Normal range of motion.  Lymphadenopathy:     Cervical: No cervical adenopathy.  Skin:    General: Skin is warm and dry.     Capillary Refill: Capillary refill takes less than 2 seconds.  Neurological:     Mental Status: She is alert and oriented to person, place, and time.  Psychiatric:        Behavior: Behavior normal.      Musculoskeletal Exam: Cervical, thoracic and lumbar spine were in good range of motion.  Shoulder joints, elbow joints, wrist joints, MCPs PIPs and DIPs been good range of motion with no synovitis.  Hip joints, knee joints, ankles, MTPs and PIPs  with good range of motion with no synovitis.  She had tenderness over bilateral trapezius region over costochondral region, lateral epicondyle, bilateral trochanteric region.  She also had tenderness over SI joints.  She had generalized hyperalgesia and positive tender points.  CDAI Exam: CDAI Score: -- Patient Global: --; Provider Global: -- Swollen: --; Tender: -- Joint Exam 08/11/2022   No joint exam has been documented for this visit   There is currently no information documented on the homunculus. Go to the Rheumatology activity and complete the homunculus joint exam.  Investigation: No additional findings.  Imaging: No results found.  Recent Labs: Lab Results  Component Value Date   WBC 5.4 07/11/2020   HGB 13.0 07/11/2020   PLT 207 07/11/2020   NA 139 07/11/2020   K 3.4 (L) 07/11/2020   CL 106 07/11/2020   CO2 25 07/11/2020   GLUCOSE 100 (H) 07/11/2020   BUN 8 07/11/2020   CREATININE 0.68 07/11/2020   CALCIUM  9.9 07/11/2020   February 20, 2022 LDL 104, CMP normal except calcium 10.3, CBC WBC 5.9, hemoglobin 15.0, platelets 269, HSV negative, RPR nonreactive, vitamin D 37, ANA 1: 320 NS, HIV nonreactive,  Speciality Comments: No specialty comments available.  Procedures:  No procedures performed Allergies: Avocado, Peanut allergen powder-dnfp, and Peanut-containing drug products   Assessment / Plan:     Visit Diagnoses: Positive ANA (antinuclear antibody) -she gives history of pain in multiple joints over the last few years.  The pain started when she was 12.  The symptoms have persisted over the years.  She complains of pain and discomfort in her shoulders, neck, hips, knees and her ankles.  She also gives history of chronic SI joint pain.  She states over the last 1 year she has been experiencing pain in her elbows and wrist joints.  She has not seen any joint swelling.  No synovitis was noted on the examination.  She gives history of sicca symptoms, plan: Protein / creatinine ratio, urine, Sedimentation rate, ANA, Anti-scleroderma antibody, RNP Antibody, Anti-Smith antibody, Sjogrens syndrome-A extractable nuclear antibody, Sjogrens syndrome-B extractable nuclear antibody, Anti-DNA antibody, double-stranded, C3 and C4, Beta-2 glycoprotein antibodies, Cardiolipin antibodies, IgG, IgM, IgA  Polyarthralgia -she complains of pain and discomfort in multiple joints.  No synovitis was noted on the examination.  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  Her mother is HLA-B27 positive.  Plan: Sedimentation rate, XR Pelvis 1-2 Views, x-rays of SI joints were unremarkable.  HLA-B27 antigen  Trochanteric bursitis of both hips-she had tenderness over the trochanteric region.  Myofascial pain-she has history of pain and discomfort since she was a teenager.  She had multiple tender points and hyperalgesia on the examination.  No  synovitis was noted on the examination today.  Need for regular activities, exercise and stretching was emphasized.  Other fatigue -I will check CK today.  Plan: CK  History of gastroesophageal reflux (GERD)-she gives history of reflux, constipation and diarrhea alternating.  She possibly have IBS.  Attention deficit disorder (ADD) without hyperactivity  Anxiety and depression-she is on medications which help.  Presence of subdermal contraceptive implant Nexplanon 06/2020  Dizziness-gives history of dizziness since she was 12.  She states dizziness has improved over the years.  Peanut allergy  Lactose intolerance  Orders: Orders Placed This Encounter  Procedures   XR Pelvis 1-2 Views   Protein / creatinine ratio, urine   CK   Sedimentation rate  ANA   Anti-scleroderma antibody   RNP Antibody   Anti-Smith antibody   Sjogrens syndrome-A extractable nuclear antibody   Sjogrens syndrome-B extractable nuclear antibody   Anti-DNA antibody, double-stranded   C3 and C4   Beta-2 glycoprotein antibodies   Cardiolipin antibodies, IgG, IgM, IgA   Rheumatoid factor   Cyclic citrul peptide antibody, IgG   HLA-B27 antigen   No orders of the defined types were placed in this encounter.   Follow-Up Instructions: Return for +ANA, joint pain.   Pollyann Savoy, MD  Note - This record has been created using [employer].  Chart creation errors have been sought, but may not always  have been located. Such creation errors do not reflect on  the standard of medical care.

## 2022-08-11 ENCOUNTER — Ambulatory Visit: Payer: BC Managed Care – PPO | Attending: Rheumatology | Admitting: Rheumatology

## 2022-08-11 ENCOUNTER — Encounter: Payer: Self-pay | Admitting: Rheumatology

## 2022-08-11 ENCOUNTER — Ambulatory Visit (INDEPENDENT_AMBULATORY_CARE_PROVIDER_SITE_OTHER): Payer: BC Managed Care – PPO

## 2022-08-11 ENCOUNTER — Other Ambulatory Visit: Payer: Self-pay

## 2022-08-11 ENCOUNTER — Other Ambulatory Visit (HOSPITAL_COMMUNITY): Payer: Self-pay

## 2022-08-11 ENCOUNTER — Encounter: Payer: BC Managed Care – PPO | Admitting: Physician Assistant

## 2022-08-11 VITALS — BP 111/76 | HR 99 | Resp 15 | Ht 64.0 in | Wt 139.4 lb

## 2022-08-11 DIAGNOSIS — M533 Sacrococcygeal disorders, not elsewhere classified: Secondary | ICD-10-CM | POA: Diagnosis not present

## 2022-08-11 DIAGNOSIS — E739 Lactose intolerance, unspecified: Secondary | ICD-10-CM

## 2022-08-11 DIAGNOSIS — G8929 Other chronic pain: Secondary | ICD-10-CM | POA: Diagnosis not present

## 2022-08-11 DIAGNOSIS — R768 Other specified abnormal immunological findings in serum: Secondary | ICD-10-CM

## 2022-08-11 DIAGNOSIS — F988 Other specified behavioral and emotional disorders with onset usually occurring in childhood and adolescence: Secondary | ICD-10-CM

## 2022-08-11 DIAGNOSIS — F32A Depression, unspecified: Secondary | ICD-10-CM

## 2022-08-11 DIAGNOSIS — Z975 Presence of (intrauterine) contraceptive device: Secondary | ICD-10-CM

## 2022-08-11 DIAGNOSIS — F419 Anxiety disorder, unspecified: Secondary | ICD-10-CM

## 2022-08-11 DIAGNOSIS — R55 Syncope and collapse: Secondary | ICD-10-CM

## 2022-08-11 DIAGNOSIS — M2550 Pain in unspecified joint: Secondary | ICD-10-CM | POA: Diagnosis not present

## 2022-08-11 DIAGNOSIS — M7918 Myalgia, other site: Secondary | ICD-10-CM

## 2022-08-11 DIAGNOSIS — M7061 Trochanteric bursitis, right hip: Secondary | ICD-10-CM | POA: Diagnosis not present

## 2022-08-11 DIAGNOSIS — M7062 Trochanteric bursitis, left hip: Secondary | ICD-10-CM

## 2022-08-11 DIAGNOSIS — Z8719 Personal history of other diseases of the digestive system: Secondary | ICD-10-CM

## 2022-08-11 DIAGNOSIS — Z91010 Allergy to peanuts: Secondary | ICD-10-CM

## 2022-08-11 DIAGNOSIS — R5383 Other fatigue: Secondary | ICD-10-CM

## 2022-08-11 DIAGNOSIS — R42 Dizziness and giddiness: Secondary | ICD-10-CM

## 2022-08-11 NOTE — Progress Notes (Unsigned)
Office Visit Note  Patient: Casey Santana             Date of Birth: 2000/11/23           MRN: [id number]             PCP: Willow Ora, MD Referring: Jeri Lager, * Visit Date: 08/24/2022 Occupation: @GUAROCC @  Subjective:  Pain in multiple joints  History of Present Illness: Casey Santana is a 22 y.o. adult history of polyarthralgia.  She states she continues to have pain and discomfort in her joints.  She experiences discomfort in her right shoulder radiating into the right wrist and right hand.  She had similar symptoms in her lower extremities with pain in her SI joint radiating into the right foot.  She had an episode about 2 or 3 days ago when her right knee appeared red and swollen then that resolved by itself.  She continues to have pain and discomfort in her SI joints.  She continues to have stiffness in her SI joints.    Activities of Daily Living:  Patient reports morning stiffness for several hours.   Patient Reports nocturnal pain.  Difficulty dressing/grooming: Denies Difficulty climbing stairs: Reports Difficulty getting out of chair: Denies Difficulty using hands for taps, buttons, cutlery, and/or writing: Reports  Review of Systems  Constitutional:  Positive for fatigue.  HENT:  Negative for mouth sores and mouth dryness.   Eyes:  Positive for dryness.  Respiratory:  Positive for shortness of breath.   Cardiovascular:  Positive for palpitations.  Gastrointestinal:  Positive for constipation and diarrhea. Negative for blood in stool.  Endocrine: Negative for increased urination.  Genitourinary:  Negative for involuntary urination.  Musculoskeletal:  Positive for joint pain, joint pain, joint swelling, myalgias, morning stiffness, muscle tenderness and myalgias. Negative for gait problem and muscle weakness.  Skin:  Positive for sensitivity to sunlight. Negative for color change, rash and hair loss.  Allergic/Immunologic: Negative for  susceptible to infections.  Neurological:  Positive for dizziness and headaches.  Hematological:  Negative for swollen glands.  Psychiatric/Behavioral:  Positive for depressed mood. Negative for sleep disturbance. The patient is nervous/anxious.     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   GAD (generalized anxiety disorder) 05/23/2020   Peanut allergy 03/03/2019   Depression, major, single episode, moderate (HCC) 03/01/2019    Past Medical History:  Diagnosis Date   Anxiety    Autoimmune disorder (HCC)    Depression    Dysmenorrhea    Heart murmur    Trichotillomania     Family History  Problem Relation Age of Onset   Arthritis Mother    Bipolar disorder Mother    Hyperlipidemia Father    Healthy Brother    Arthritis Maternal Grandmother    Hearing loss Maternal Grandmother    Hyperlipidemia Maternal Grandmother    Hypertension Maternal Grandmother    Anxiety disorder Maternal Grandmother    Arthritis Maternal Grandfather    Cancer Maternal Grandfather    Depression Maternal Grandfather    Hearing loss Maternal Grandfather    Hyperlipidemia Maternal Grandfather    Other Maternal Grandfather        back pain   Hyperlipidemia Paternal Grandmother    Alzheimer's disease Paternal Grandmother  Hyperlipidemia Paternal Grandfather    Bipolar disorder Other    Schizophrenia Other    Migraines Neg Hx    Seizures Neg Hx    Autism Neg Hx    ADD / ADHD Neg Hx    Past Surgical History:  Procedure Laterality Date   NO PAST SURGERIES     WISDOM TOOTH EXTRACTION     Social History   Social History Narrative   ** Merged History Encounter **       Lives with mom, dad and brother when he comes home from college. She plans on attending [employer]    Immunization History  Administered Date(s) Administered   DTaP 02/28/2001, 05/05/2001, 07/13/2001, 05/19/2002, 01/16/2005   H1N1 04/06/2008   HIB (PRP-OMP) 02/28/2001, 05/05/2001, 07/13/2001, 05/19/2002   HPV 9-valent 11/08/2014, 01/25/2017   Hepatitis A 01/16/2005, 07/17/2005   Hepatitis B 30-Aug-2000, 02/28/2001, 11/03/2001   IPV 02/28/2001, 05/05/2001, 11/03/2001, 01/16/2005   Influenza,inj,Quad PF,6+ Mos 11/27/2016, 02/13/2019, 12/27/2019   Influenza-Unspecified 01/05/2007, 01/22/2011, 12/31/2011, 12/15/2015   MMR 01/04/2002, 01/16/2005   Meningococcal B, OMV 02/24/2018, 09/02/2018   Meningococcal Conjugate 11/08/2014   Meningococcal Mcv4o 02/24/2018   PFIZER(Purple Top)SARS-COV-2 Vaccination 06/30/2019, 07/24/2019, 02/07/2020   Pneumococcal Conjugate-13 02/28/2001, 05/05/2001, 07/13/2001, 12/15/2002   Tdap 09/25/2011   Varicella 01/04/2002, 01/16/2005     Objective: Vital Signs: BP 126/87 (BP Location: Left Arm, Patient Position: Sitting, Cuff Size: Normal)   Pulse 92   Resp 13   Ht 5\' 4"  (1.626 m)   Wt 137 lb 6.4 oz (62.3 kg)   BMI 23.58 kg/m    Physical Exam Vitals and nursing note reviewed.  Constitutional:      Appearance: She is well-developed.  HENT:     Head: Normocephalic and atraumatic.  Eyes:     Conjunctiva/sclera: Conjunctivae normal.  Cardiovascular:     Rate and Rhythm: Normal rate and regular rhythm.     Heart sounds: Normal heart sounds.  Pulmonary:     Effort: Pulmonary effort is normal.     Breath sounds: Normal breath sounds.  Abdominal:     General: Bowel sounds are normal.     Palpations: Abdomen is soft.  Musculoskeletal:     Cervical back: Normal range of motion.  Lymphadenopathy:     Cervical: No cervical adenopathy.  Skin:    General: Skin is warm and dry.     Capillary Refill: Capillary refill takes less than 2 seconds.  Neurological:     Mental Status: She is alert and oriented to person, place, and time.  Psychiatric:         Behavior: Behavior normal.      Musculoskeletal Exam: Cervical, thoracic and lumbar spine were in good range of motion.  She had no difficulty reaching her toes.  She had mild tenderness on palpation over lower lumbar spine and over the SI joints.  Shoulder joints were in good range of motion.  She had some discomfort with abduction of her right shoulder joint.  Elbow joints, wrist joints, MCPs PIPs and DIPs were in good range of motion with no synovitis.  Hip joints and knee joints were in good range of motion without any warmth swelling or effusion.  There was no Achilles tendinitis of [place] fasciitis.  There was no synovitis over ankles or MTPs.  She had generalized hyperalgesia.  CDAI Exam: CDAI Score: -- Patient Global: --; Provider Global: -- Swollen: --; Tender: -- Joint Exam 08/24/2022   No joint exam has been documented for this visit  There is currently no information documented on the homunculus. Go to the Rheumatology activity and complete the homunculus joint exam.  Investigation: No additional findings.  Imaging: XR Pelvis 1-2 Views  Result Date: 08/11/2022 No SI joint sclerosis or narrowing was noted.  No hip joint narrowing was noted. Impression: Unremarkable x-rays of the pelvis.   Recent Labs: Lab Results  Component Value Date   WBC 5.4 07/11/2020   HGB 13.0 07/11/2020   PLT 207 07/11/2020   NA 139 07/11/2020   K 3.4 (L) 07/11/2020   CL 106 07/11/2020   CO2 25 07/11/2020   GLUCOSE 100 (H) 07/11/2020   BUN 8 07/11/2020   CREATININE 0.68 07/11/2020   CALCIUM 9.9 07/11/2020   Aug 11, 2022 urine protein creatinine ratio normal, ANA 1: 320 NS ENA (SCL 70, RNP, Smith, SSA, SSB, dsDNA) negative, C3-C4 normal, anticardiolipin negative, beta-2 GP 1 negative, ESR 2, CK 75, RF negative, anti-CCP negative, HLA-B27 positive  Speciality Comments: No specialty comments available.  Procedures:  No procedures performed Allergies: Avocado, Peanut allergen powder-dnfp,  and Peanut-containing drug products   Assessment / Plan:     Visit Diagnoses: Positive ANA (antinuclear antibody) -Labs obtained on Aug 11, 2022 ANA 1: 320 NS ENA (SCL 70, RNP, Smith, SSA, SSB, dsDNA) negative, C3-C4 normal, anticardiolipin negative, beta-2 GP 1 negative, ESR 2.  Labs were reviewed with the patient.  Patient has been experiencing polyarthralgias since age 78.  She continues to have discomfort in multiple joints.  No synovitis was noted on the examination today at the last visit.  She states she had some pain and redness in her right knee joint few days back which resolved by itself.  History of sicca symptoms.  HLA B27 positive-her mother is HLA-B27 positive.  She is also HLA-B27 positive.  She gives history of chronic SI joint pain for several years now.  She had good mobility in her lumbar spine and had no difficulty reaching her toes.  She had some tenderness over the lower lumbar region and over the SI joints.  Polyarthralgia -she complains of discomfort in multiple joints including her shoulders, hands, hips, knees, ankles, neck and SI joints.  No synovitis was noted on the examination.  Chronic SI joint pain - History of chronic SI joint pain.  She had tenderness on the palpation of bilateral SI joints.  She gives history of morning stiffness and constant pain in her SI joints.  X-rays were unremarkable.  X-ray findings were reviewed with the patient.  Will schedule MRI of the SI joint to look for sacroiliitis as she has significant morning stiffness.  She is also HLA-B27 positive.  Trochanteric bursitis of both hips-she has off-and-on discomfort in the trochanteric region.  Myofascial pain-she has generalized pain and positive tender points.  She had discomfort in the bilateral trapezius region and bilateral trochanteric region.  She also had hyperalgesia in multiple areas.  Benefits of water aerobics and stretching were discussed.  Other medical problems are listed as  follows:  History of gastroesophageal reflux (GERD)  Attention deficit disorder (ADD) without hyperactivity  Anxiety and depression  Presence of subdermal contraceptive implant Nexplanon 06/2020  Peanut allergy  Lactose intolerance  Dizziness  Orders: No orders of the defined types were placed in this encounter.  No orders of the defined types were placed in this encounter.   Follow-Up Instructions: Return in about 6 weeks (around 10/05/2022) for polyarthralgia, HLA B27+.   Pollyann Savoy, MD  Note - This record has  been created using [employer].  Chart creation errors have been sought, but may not always  have been located. Such creation errors do not reflect on  the standard of medical care.

## 2022-08-12 LAB — C3 AND C4
C3 Complement: 103 mg/dL (ref 83–193)
C4 Complement: 21 mg/dL (ref 15–57)

## 2022-08-12 LAB — ANTI-DNA ANTIBODY, DOUBLE-STRANDED: ds DNA Ab: <1 IU/mL

## 2022-08-12 LAB — ANTI-SCLERODERMA ANTIBODY: Scleroderma (Scl-70) (ENA) Antibody, IgG: <1 AI

## 2022-08-13 LAB — ANTI-NUCLEAR AB-TITER (ANA TITER)

## 2022-08-13 LAB — ANTI-SMITH ANTIBODY: ENA SM Ab Ser-aCnc: <1 AI

## 2022-08-13 LAB — HLA-B27 ANTIGEN: HLA-B27 Antigen: POSITIVE — AB

## 2022-08-13 LAB — CK: Total CK: 75 U/L (ref 29–143)

## 2022-08-20 LAB — CARDIOLIPIN ANTIBODIES, IGG, IGM, IGA
Anticardiolipin IgA: <2 APL-U/mL (ref <20.0)
Anticardiolipin IgG: <2 GPL-U/mL (ref <20.0)
Anticardiolipin IgM: <2 MPL-U/mL (ref <20.0)

## 2022-08-20 LAB — ANTI-NUCLEAR AB-TITER (ANA TITER): ANA Titer 1: 1:320 {titer} — ABNORMAL HIGH

## 2022-08-20 LAB — PROTEIN / CREATININE RATIO, URINE
Creatinine, Urine: 108 mg/dL (ref 20–275)
Protein/Creat Ratio: 65 mg/g creat (ref 24–184)
Protein/Creatinine Ratio: 0.065 mg/mg creat (ref 0.024–0.184)
Total Protein, Urine: 7 mg/dL (ref 5–24)

## 2022-08-20 LAB — RHEUMATOID FACTOR: Rheumatoid fact SerPl-aCnc: <10 IU/mL (ref <14)

## 2022-08-20 LAB — BETA-2 GLYCOPROTEIN ANTIBODIES
Beta-2 Glyco 1 IgA: <2 U/mL (ref <20.0)
Beta-2 Glyco 1 IgM: <2 U/mL (ref <20.0)
Beta-2 Glyco I IgG: <2 U/mL (ref <20.0)

## 2022-08-20 LAB — SJOGRENS SYNDROME-B EXTRACTABLE NUCLEAR ANTIBODY: SSB (La) (ENA) Antibody, IgG: <1 AI

## 2022-08-20 LAB — RNP ANTIBODY: Ribonucleic Protein(ENA) Antibody, IgG: <1 AI

## 2022-08-20 LAB — SEDIMENTATION RATE: Sed Rate: 2 mm/h (ref 0–20)

## 2022-08-20 LAB — SJOGRENS SYNDROME-A EXTRACTABLE NUCLEAR ANTIBODY: SSA (Ro) (ENA) Antibody, IgG: <1 AI

## 2022-08-20 LAB — CYCLIC CITRUL PEPTIDE ANTIBODY, IGG: Cyclic Citrullin Peptide Ab: <16 UNITS

## 2022-08-20 LAB — ANA: Anti Nuclear Antibody (ANA): POSITIVE — AB

## 2022-08-20 NOTE — Progress Notes (Signed)
I will discuss results at the follow-up visit.

## 2022-08-24 ENCOUNTER — Ambulatory Visit: Payer: BC Managed Care – PPO | Attending: Rheumatology | Admitting: Rheumatology

## 2022-08-24 ENCOUNTER — Encounter: Payer: Self-pay | Admitting: Rheumatology

## 2022-08-24 VITALS — BP 126/87 | HR 92 | Resp 13 | Ht 64.0 in | Wt 137.4 lb

## 2022-08-24 DIAGNOSIS — R768 Other specified abnormal immunological findings in serum: Secondary | ICD-10-CM

## 2022-08-24 DIAGNOSIS — M7061 Trochanteric bursitis, right hip: Secondary | ICD-10-CM

## 2022-08-24 DIAGNOSIS — M7062 Trochanteric bursitis, left hip: Secondary | ICD-10-CM

## 2022-08-24 DIAGNOSIS — M7918 Myalgia, other site: Secondary | ICD-10-CM

## 2022-08-24 DIAGNOSIS — Z1589 Genetic susceptibility to other disease: Secondary | ICD-10-CM | POA: Diagnosis not present

## 2022-08-24 DIAGNOSIS — F32A Depression, unspecified: Secondary | ICD-10-CM

## 2022-08-24 DIAGNOSIS — M2550 Pain in unspecified joint: Secondary | ICD-10-CM | POA: Diagnosis not present

## 2022-08-24 DIAGNOSIS — E739 Lactose intolerance, unspecified: Secondary | ICD-10-CM

## 2022-08-24 DIAGNOSIS — Z975 Presence of (intrauterine) contraceptive device: Secondary | ICD-10-CM

## 2022-08-24 DIAGNOSIS — R42 Dizziness and giddiness: Secondary | ICD-10-CM

## 2022-08-24 DIAGNOSIS — F419 Anxiety disorder, unspecified: Secondary | ICD-10-CM

## 2022-08-24 DIAGNOSIS — M533 Sacrococcygeal disorders, not elsewhere classified: Secondary | ICD-10-CM | POA: Diagnosis not present

## 2022-08-24 DIAGNOSIS — Z8719 Personal history of other diseases of the digestive system: Secondary | ICD-10-CM

## 2022-08-24 DIAGNOSIS — R7689 Other specified abnormal immunological findings in serum: Secondary | ICD-10-CM

## 2022-08-24 DIAGNOSIS — Z91010 Allergy to peanuts: Secondary | ICD-10-CM

## 2022-08-24 DIAGNOSIS — G8929 Other chronic pain: Secondary | ICD-10-CM

## 2022-08-24 DIAGNOSIS — F988 Other specified behavioral and emotional disorders with onset usually occurring in childhood and adolescence: Secondary | ICD-10-CM

## 2022-08-24 NOTE — Addendum Note (Signed)
Addended by: Audrie Lia on: 08/24/2022 03:40 PM   Modules accepted: Orders

## 2022-08-25 ENCOUNTER — Telehealth: Payer: Self-pay | Admitting: Rheumatology

## 2022-08-25 NOTE — Telephone Encounter (Signed)
Authorization for MRI of SI joints: I spoke with Dr.Gianakakos and explained patient's situation.  She approved MRI of the SI joints.  Authorization #[id number] valid through September 23, 2022.  Pollyann Savoy, MD

## 2022-08-28 ENCOUNTER — Ambulatory Visit (HOSPITAL_COMMUNITY)
Admission: RE | Admit: 2022-08-28 | Discharge: 2022-08-28 | Disposition: A | Discharge status: Home / Self Care | Payer: BC Managed Care – PPO | Source: Ambulatory Visit | Attending: Rheumatology | Admitting: Rheumatology

## 2022-08-28 DIAGNOSIS — M533 Sacrococcygeal disorders, not elsewhere classified: Secondary | ICD-10-CM | POA: Diagnosis present

## 2022-08-28 DIAGNOSIS — G8929 Other chronic pain: Secondary | ICD-10-CM

## 2022-08-28 DIAGNOSIS — M461 Sacroiliitis, not elsewhere classified: Secondary | ICD-10-CM | POA: Diagnosis not present

## 2022-08-28 DIAGNOSIS — Z1589 Genetic susceptibility to other disease: Secondary | ICD-10-CM | POA: Diagnosis present

## 2022-09-03 ENCOUNTER — Encounter: Payer: BC Managed Care – PPO | Admitting: Rheumatology

## 2022-09-06 NOTE — Progress Notes (Signed)
MRI of pelvis is normal. No inflammation noted in the SI joints.

## 2022-09-07 ENCOUNTER — Other Ambulatory Visit (HOSPITAL_COMMUNITY): Payer: Self-pay

## 2022-09-08 ENCOUNTER — Other Ambulatory Visit (HOSPITAL_COMMUNITY): Payer: Self-pay

## 2022-09-10 ENCOUNTER — Other Ambulatory Visit (HOSPITAL_COMMUNITY): Payer: Self-pay

## 2022-09-10 ENCOUNTER — Telehealth: Payer: Self-pay | Admitting: Adult Health

## 2022-09-10 NOTE — Telephone Encounter (Signed)
Pt called at 4:36.  Adderall script was written for 90 days back in May, but her insurance will only pay for 30 days at a time.  She got the 30 days in May and now is requesting the 30 day script for June.  Next appt 8/13

## 2022-09-11 ENCOUNTER — Other Ambulatory Visit: Payer: Self-pay

## 2022-09-11 ENCOUNTER — Other Ambulatory Visit (HOSPITAL_COMMUNITY): Payer: Self-pay

## 2022-09-11 DIAGNOSIS — F909 Attention-deficit hyperactivity disorder, unspecified type: Secondary | ICD-10-CM

## 2022-09-11 MED ORDER — AMPHETAMINE-DEXTROAMPHETAMINE 30 MG PO TABS
30.0000 mg | ORAL_TABLET | Freq: Every day | ORAL | 0 refills | Status: DC
Start: 2022-09-11 — End: 2022-10-06
  Filled 2022-09-11: qty 30, 30d supply, fill #0

## 2022-09-11 NOTE — Telephone Encounter (Signed)
Pended.

## 2022-09-24 ENCOUNTER — Ambulatory Visit: Payer: BC Managed Care – PPO | Admitting: Rheumatology

## 2022-10-06 ENCOUNTER — Other Ambulatory Visit: Payer: Self-pay

## 2022-10-06 ENCOUNTER — Other Ambulatory Visit: Payer: Self-pay | Admitting: Adult Health

## 2022-10-06 DIAGNOSIS — F909 Attention-deficit hyperactivity disorder, unspecified type: Secondary | ICD-10-CM

## 2022-10-06 NOTE — Telephone Encounter (Signed)
LF 6/28, due 7/26

## 2022-10-08 NOTE — Telephone Encounter (Signed)
Pt called at 11:02 stating she called 2 days ago about Adderall refill.  I called her back and advised it would be filled tomorrow based on the last time she picked it up.

## 2022-10-09 ENCOUNTER — Other Ambulatory Visit (HOSPITAL_COMMUNITY): Payer: Self-pay

## 2022-10-09 MED ORDER — AMPHETAMINE-DEXTROAMPHETAMINE 30 MG PO TABS
30.0000 mg | ORAL_TABLET | Freq: Every day | ORAL | 0 refills | Status: DC
Start: 2022-10-09 — End: 2022-10-27
  Filled 2022-10-09: qty 30, 30d supply, fill #0

## 2022-10-20 ENCOUNTER — Other Ambulatory Visit: Payer: Self-pay

## 2022-10-20 ENCOUNTER — Ambulatory Visit: Payer: BC Managed Care – PPO | Admitting: Cardiology

## 2022-10-20 ENCOUNTER — Encounter: Payer: Self-pay | Admitting: Cardiology

## 2022-10-20 ENCOUNTER — Other Ambulatory Visit: Payer: BC Managed Care – PPO

## 2022-10-20 VITALS — BP 114/69 | HR 90 | Resp 16 | Ht 64.0 in | Wt 142.2 lb

## 2022-10-20 DIAGNOSIS — R002 Palpitations: Secondary | ICD-10-CM

## 2022-10-20 DIAGNOSIS — R42 Dizziness and giddiness: Secondary | ICD-10-CM

## 2022-10-20 NOTE — Progress Notes (Addendum)
Patient referred by Willow Ora, MD for palpitations, dizziness  Subjective:   Casey Santana, adult    DOB: [birth year], 21 y.o.   MRN: [id number]   Chief Complaint  Patient presents with   Loss of Consciousness   New Patient (Initial Visit)    HPI  22 y.o. Caucasian adult with anxiety, depression, referred for palpitations, dizziness  Patient is currently in the process of moving to [place] to start college. Patient has had palpitations and dizziness for the past 6 years.  Palpitations last for 15 or 30-minute, sometimes associated with shortness of breath.  Separately, she denies dizziness and rapid heart rate.  She stands up, but denies any syncope.  She has occasional chest pain lasting only 5-10 seconds, no other physical activity.She drinks about 32 ounces of water during the day, occasionally drinks caffeine and alcohol.  She does walking symptoms, but plans on doing more physical activity after she moves to the rectum.   Past Medical History:  Diagnosis Date   Anxiety    Autoimmune disorder (HCC)    Depression    Dysmenorrhea    Heart murmur    Trichotillomania      Past Surgical History:  Procedure Laterality Date   NO PAST SURGERIES     WISDOM TOOTH EXTRACTION       Social History   Tobacco Use  Smoking Status Never   Passive exposure: Never  Smokeless Tobacco Never    Social History   Substance and Sexual Activity  Alcohol Use No     Family History  Problem Relation Age of Onset   Arthritis Mother    Bipolar disorder Mother    Hyperlipidemia Father    Healthy Brother    Arthritis Maternal Grandmother    Hearing loss Maternal Grandmother    Hyperlipidemia Maternal Grandmother    Hypertension Maternal Grandmother    Anxiety disorder Maternal Grandmother    Arthritis Maternal Grandfather    Cancer Maternal Grandfather    Depression Maternal Grandfather    Hearing loss Maternal Grandfather    Hyperlipidemia Maternal Grandfather     Other Maternal Grandfather        back pain   Hyperlipidemia Paternal Grandmother    Alzheimer's disease Paternal Grandmother    Hyperlipidemia Paternal Grandfather    Bipolar disorder Other    Schizophrenia Other    Migraines Neg Hx    Seizures Neg Hx    Autism Neg Hx    ADD / ADHD Neg Hx       Current Outpatient Medications:    amphetamine-dextroamphetamine (ADDERALL) 30 MG tablet, Take 1 tablet by mouth daily., Disp: 30 tablet, Rfl: 0   EPINEPHrine (EPIPEN 2-PAK) 0.3 mg/0.3 mL IJ SOAJ injection, Inject 0.3 mg into the muscle as needed for anaphylaxis. (Patient not taking: Reported on 07/14/2022), Disp: 1 each, Rfl: 2   etonogestrel (NEXPLANON) 68 MG IMPL implant, 1 each by Subdermal route once., Disp: , Rfl:    ibuprofen (ADVIL) 800 MG tablet, Take 1 tablet (800 mg total) by mouth every 8 (eight) hours as needed for pain, Disp: 60 tablet, Rfl: 3   propranolol (INDERAL) 10 MG tablet, Take 1 tablet (10 mg total) by mouth 2 (two) times daily., Disp: 180 tablet, Rfl: 1   Cardiovascular and other pertinent studies:  Reviewed external labs and tests, independently interpreted  EKG 10/20/2022: Sinus rhythm 89 bpm Normal EKG    Recent labs: 02/20/2022: Glucose 70, BUN/Cr 10/0.66. EGFR 128. Na/K 139/3.8. Rest  of the CMP normal H/H 15/44. MCV 91. Platelets 269 HbA1C NA Chol 172, TG 63, HDL 53, LDL 104 TSH NA   Review of Systems  Cardiovascular:  Positive for palpitations. Negative for chest pain, dyspnea on exertion, leg swelling and syncope.         Vitals:   10/20/22 0913 10/20/22 0914  BP:    Pulse:    Resp:    SpO2: 100% 98%   Orthostatic VS for the past 72 hrs (Last 3 readings):  Orthostatic BP Patient Position BP Location Cuff Size Orthostatic Pulse  10/20/22 0914 107/82 Standing Left Arm Normal 80  10/20/22 0913 110/75 Sitting Left Arm Normal 110  10/20/22 0912 117/69 Supine Left Arm Normal 89     Body mass index is 24.41 kg/m. Filed Weights    10/20/22 0907  Weight: 142 lb 3.2 oz (64.5 kg)     Objective:   Physical Exam Vitals and nursing note reviewed.  Constitutional:      General: She is not in acute distress. Neck:     Vascular: No JVD.  Cardiovascular:     Rate and Rhythm: Normal rate and regular rhythm.     Heart sounds: Normal heart sounds. No murmur heard. Pulmonary:     Effort: Pulmonary effort is normal.     Breath sounds: Normal breath sounds. No wheezing or rales.  Musculoskeletal:     Right lower leg: No edema.     Left lower leg: No edema.          Visit diagnoses:   ICD-10-CM   1. Postural dizziness  R42 EKG 12-Lead    2. Palpitations  R00.2        Orders Placed This Encounter  Procedures   EKG 12-Lead       Assessment & Recommendations:   22 y.o. Caucasian adult with anxiety, depression, referred for palpitations, dizziness  Constellation of symptoms and increasing heart rate by 30 bpm with sitting up, to suggest POTS.  Recommend increase hydration, and regular use of compression stockings.  Also recommend increasing physical activity.  She denies any dizziness with regular exercise.  Not recommend any medications at this time.  Will place on doing a cardiac telemetry.  She is in the process of moving at this point.  If symptoms not improving for months, could consider retesting, including echocardiogram, exercise stress test.  Thank you for referring the patient to [place]. Please feel free to contact with any questions.   Elder Negus, MD Pager: [phone] Office: [phone]

## 2022-10-27 ENCOUNTER — Telehealth (INDEPENDENT_AMBULATORY_CARE_PROVIDER_SITE_OTHER): Payer: BC Managed Care – PPO | Admitting: Adult Health

## 2022-10-27 ENCOUNTER — Telehealth: Payer: Self-pay | Admitting: Adult Health

## 2022-10-27 ENCOUNTER — Encounter: Payer: Self-pay | Admitting: Adult Health

## 2022-10-27 DIAGNOSIS — F909 Attention-deficit hyperactivity disorder, unspecified type: Secondary | ICD-10-CM

## 2022-10-27 DIAGNOSIS — F339 Major depressive disorder, recurrent, unspecified: Secondary | ICD-10-CM | POA: Diagnosis not present

## 2022-10-27 DIAGNOSIS — F411 Generalized anxiety disorder: Secondary | ICD-10-CM | POA: Diagnosis not present

## 2022-10-27 DIAGNOSIS — F331 Major depressive disorder, recurrent, moderate: Secondary | ICD-10-CM

## 2022-10-27 MED ORDER — AMPHETAMINE-DEXTROAMPHETAMINE 30 MG PO TABS
30.0000 mg | ORAL_TABLET | Freq: Every day | ORAL | 0 refills | Status: DC
Start: 2022-10-27 — End: 2022-11-09

## 2022-10-27 MED ORDER — PROPRANOLOL HCL 10 MG PO TABS: 10.0000 mg | ORAL_TABLET | Freq: Two times a day (BID) | ORAL | 1 refills | Status: DC

## 2022-10-27 NOTE — Telephone Encounter (Signed)
[facility] pharmacy called and said they are a closed door pharmacy and can't fill controlled substances.  The prescriptions for Adderall and Inderal will need to be sent somewhere else.

## 2022-10-27 NOTE — Telephone Encounter (Signed)
LVM to [facility]. Took pharmacy off her profile.

## 2022-10-27 NOTE — Progress Notes (Signed)
Casey Santana [id number] March 15, 2001 22 y.o.  Virtual Visit via Video Note  I connected with pt @ on 10/27/22 at 12:00 PM EDT by a video enabled telemedicine application and verified that I am speaking with the correct person using two identifiers.   I discussed the limitations of evaluation and management by telemedicine and the availability of in person appointments. The patient expressed understanding and agreed to proceed.  I discussed the assessment and treatment plan with the patient. The patient was provided an opportunity to ask questions and all were answered. The patient agreed with the plan and demonstrated an understanding of the instructions.   The patient was advised to call back or seek an in-person evaluation if the symptoms worsen or if the condition fails to improve as anticipated.  I provided 15 minutes of non-face-to-face time during this encounter.  The patient was located at home.  The provider was located at [facility].   Casey Gibbs, NP   Subjective:   Patient ID:  Casey Santana is a 22 y.o. (DOB 03-13-2001) adult.  Chief Complaint: No chief complaint on file.   HPI Casey Santana presents for follow-up of MDD, ADHD, and GAD.  Describes mood today as "ok". Pleasant. Denies tearful. Mood symptoms - reports some anxiety, irritability and depression - recent move. Denies panic attacks. Reports some worry, rumination, and over thinking with recent move. Mood is consistent. Stating "I feel like I'm doing alright". Feels like current medications are helpful. Working with therapist - telehealth. Improved interest and motivation. Taking medications as prescribed.  Energy levels stable. Active, has a regular exercise program.   Enjoys some usual interests and activities. Lives in [place] with boyfriend and cat "Mellow". Parents in [place]. Has a brother - living in [place]. Spending time with family.  Appetite adequate. Weight stable - 126  pounds. Sleeps well most nights. Averages 6 to 7.5 hours. Focus and concentration "ok". Completing tasks. Managing aspects of household. Attending [employer]. Denies SI or HI.  Denies AH or VH.  Denies self harm.  Denies substance use.  Previous medication trials: Lexapro, Prozac, Propanolol, Cymbalta, Wellbutrin XL, Abilify, Pristiq   Review of Systems:  Review of Systems  Musculoskeletal:  Negative for gait problem.  Neurological:  Negative for tremors.  Psychiatric/Behavioral:         Please refer to HPI    Medications: I have reviewed the patient's current medications.  Current Outpatient Medications  Medication Sig Dispense Refill   amphetamine-dextroamphetamine (ADDERALL) 30 MG tablet Take 1 tablet by mouth daily. 30 tablet 0   EPINEPHrine (EPIPEN 2-PAK) 0.3 mg/0.3 mL IJ SOAJ injection Inject 0.3 mg into the muscle as needed for anaphylaxis. 1 each 2   etonogestrel (NEXPLANON) 68 MG IMPL implant 1 each by Subdermal route once.     ibuprofen (ADVIL) 800 MG tablet Take 1 tablet (800 mg total) by mouth every 8 (eight) hours as needed for pain 60 tablet 3   propranolol (INDERAL) 10 MG tablet Take 1 tablet (10 mg total) by mouth 2 (two) times daily. 180 tablet 1   No current facility-administered medications for this visit.    Medication Side Effects: None  Allergies:  Allergies  Allergen Reactions   Avocado    Peanut Allergen Powder-Dnfp    Peanut-Containing Drug Products     Past Medical History:  Diagnosis Date   Anxiety    Autoimmune disorder (HCC)    Depression    Dysmenorrhea    Heart murmur  Trichotillomania     Family History  Problem Relation Age of Onset   Arthritis Mother    Bipolar disorder Mother    Hyperlipidemia Father    Healthy Brother    Arthritis Maternal Grandmother    Hearing loss Maternal Grandmother    Hyperlipidemia Maternal Grandmother    Hypertension Maternal Grandmother    Anxiety disorder Maternal Grandmother    Arthritis  Maternal Grandfather    Cancer Maternal Grandfather    Depression Maternal Grandfather    Hearing loss Maternal Grandfather    Hyperlipidemia Maternal Grandfather    Other Maternal Grandfather        back pain   Hyperlipidemia Paternal Grandmother    Alzheimer's disease Paternal Grandmother    Hyperlipidemia Paternal Grandfather    Bipolar disorder Other    Schizophrenia Other    Migraines Neg Hx    Seizures Neg Hx    Autism Neg Hx    ADD / ADHD Neg Hx     Social History   Socioeconomic History   Marital status: Single    Spouse name: Not on file   Number of children: Not on file   Years of education: Not on file   Highest education level: Not on file  Occupational History   Not on file  Tobacco Use   Smoking status: Never    Passive exposure: Never   Smokeless tobacco: Never  Vaping Use   Vaping status: Never Used  Substance and Sexual Activity   Alcohol use: No   Drug use: No   Sexual activity: Yes    Birth control/protection: Implant  Other Topics Concern   Not on file  Social History Narrative   ** Merged History Encounter **       Lives with mom, dad and brother when he comes home from college. She plans on attending [employer]   Social Determinants of Health   Financial Resource Strain: Not on file  Food Insecurity: Not on file  Transportation Needs: Not on file  Physical Activity: Not on file  Stress: Not on file  Social Connections: Not on file  Intimate Partner Violence: Not on file    Past Medical History, Surgical history, Social history, and Family history were reviewed and updated as appropriate.   Please see review of systems for further details on the patient's review from today.   Objective:   Physical Exam:  There were no vitals taken for this visit.  Physical Exam Constitutional:      General: She is not in acute distress. Musculoskeletal:        General: No deformity.  Neurological:     Mental Status: She is alert and  oriented to person, place, and time.     Coordination: Coordination normal.  Psychiatric:        Attention and Perception: Attention and perception normal. She does not perceive auditory or visual hallucinations.        Mood and Affect: Affect is not labile, blunt, angry or inappropriate.        Speech: Speech normal.        Behavior: Behavior normal.        Thought Content: Thought content normal. Thought content is not paranoid or delusional. Thought content does not include homicidal or suicidal ideation. Thought content does not include homicidal or suicidal plan.        Cognition and Memory: Cognition and memory normal.        Judgment: Judgment normal.  Comments: Insight intact     Lab Review:     Component Value Date/Time   NA 139 07/11/2020 1738   K 3.4 (L) 07/11/2020 1738   CL 106 07/11/2020 1738   CO2 25 07/11/2020 1738   GLUCOSE 100 (H) 07/11/2020 1738   BUN 8 07/11/2020 1738   CREATININE 0.68 07/11/2020 1738   CALCIUM 9.9 07/11/2020 1738   GFRNONAA 128 02/23/2022 1511       Component Value Date/Time   WBC 5.4 07/11/2020 1738   RBC 4.24 07/11/2020 1738   HGB 13.0 07/11/2020 1738   HCT 37.8 07/11/2020 1738   PLT 207 07/11/2020 1738   MCV 89.2 07/11/2020 1738   MCH 30.7 07/11/2020 1738   MCHC 34.4 07/11/2020 1738   RDW 12.0 07/11/2020 1738   LYMPHSABS 2.2 05/23/2020 0828   MONOABS 0.4 05/23/2020 0828   EOSABS 0.2 05/23/2020 0828   BASOSABS 0.1 05/23/2020 0828    No results found for: "POCLITH", "LITHIUM"   No results found for: "PHENYTOIN", "PHENOBARB", "VALPROATE", "CBMZ"   .res Assessment: Plan:    Plan:  PDMP reviewed  1. Adderall 30mg  daily. 2. Propanolol 10mg  BID for anxiety  Monitor BP between visits while taking stimulant medication.   40/58 Attention Deficit Disorder likely  Meets DSM 5 criteria for ADHD  RTC 3 months  Patient advised to contact office with any questions, adverse effects, or acute worsening in signs and  symptoms.  Discussed potential benefits, risks, and side effects of stimulants with patient to include increased heart rate, palpitations, insomnia, increased anxiety, increased irritability, or decreased appetite.  Instructed patient to contact office if experiencing any significant tolerability issues.    There are no diagnoses linked to this encounter.   Please see After Visit Summary for patient specific instructions.  Future Appointments  Date Time Provider Department Center  10/27/2022 12:00 PM Daevon Holdren, Thereasa Solo, NP [facility] None  12/24/2022  1:15 PM Patwardhan, Anabel Bene, MD [facility] None  12/29/2022  9:20 AM Deveshwar, Janalyn Rouse, MD [facility] None    No orders of the defined types were placed in this encounter.     -------------------------------

## 2022-10-28 NOTE — Telephone Encounter (Signed)
Please call to schedule FU appt .

## 2022-10-29 NOTE — Telephone Encounter (Signed)
LM 8/15 to call office to give name of pharmacy to send prescriptions and to also make her 3 month follow up appt.

## 2022-11-09 ENCOUNTER — Other Ambulatory Visit: Payer: Self-pay

## 2022-11-09 ENCOUNTER — Telehealth: Payer: Self-pay | Admitting: Adult Health

## 2022-11-09 DIAGNOSIS — F909 Attention-deficit hyperactivity disorder, unspecified type: Secondary | ICD-10-CM

## 2022-11-09 DIAGNOSIS — F411 Generalized anxiety disorder: Secondary | ICD-10-CM

## 2022-11-09 MED ORDER — AMPHETAMINE-DEXTROAMPHETAMINE 30 MG PO TABS
30.0000 mg | ORAL_TABLET | Freq: Every day | ORAL | 0 refills | Status: DC
Start: 2022-11-09 — End: 2022-12-08

## 2022-11-09 MED ORDER — PROPRANOLOL HCL 10 MG PO TABS
10.0000 mg | ORAL_TABLET | Freq: Two times a day (BID) | ORAL | 0 refills | Status: DC
Start: 2022-11-09 — End: 2022-11-27

## 2022-11-09 NOTE — Telephone Encounter (Signed)
Sent propranolol, pended Adderall

## 2022-11-09 NOTE — Telephone Encounter (Signed)
Casey Santana called at 11:40 to request refilled for her Adderall and Propanolol.  Need to be sent to a new pharmacy - [facility] at [address], [place], [place]  Appt 11/27

## 2022-11-27 ENCOUNTER — Other Ambulatory Visit: Payer: Self-pay | Admitting: Cardiology

## 2022-11-27 MED ORDER — METOPROLOL TARTRATE 25 MG PO TABS: 25.0000 mg | ORAL_TABLET | Freq: Two times a day (BID) | ORAL | 3 refills | Status: AC

## 2022-11-27 NOTE — Progress Notes (Signed)
Discussed results with the patient.  I will change her propranolol to metoprolol for better management of palpitations and nonsustained VT.  Stop propranolol 10 mg twice daily, start metoprolol tartrate 25 mg twice daily.  Elder Negus, MD

## 2022-12-07 ENCOUNTER — Encounter: Payer: Self-pay | Admitting: Cardiology

## 2022-12-08 ENCOUNTER — Telehealth: Payer: Self-pay | Admitting: Adult Health

## 2022-12-08 ENCOUNTER — Other Ambulatory Visit: Payer: Self-pay

## 2022-12-08 DIAGNOSIS — F909 Attention-deficit hyperactivity disorder, unspecified type: Secondary | ICD-10-CM

## 2022-12-08 NOTE — Telephone Encounter (Signed)
Patient lvm requesting a refill on the Adderall 30 mg tab. Send to the [facility] [id number] [facility], [place] - [address] [place], [place] [address] Phone: [phone]  Fax: [phone]   Appointment scheduled 02/10/23 Contact # [phone]

## 2022-12-08 NOTE — Telephone Encounter (Signed)
Pended.

## 2022-12-09 MED ORDER — AMPHETAMINE-DEXTROAMPHETAMINE 30 MG PO TABS: 30.0000 mg | ORAL_TABLET | Freq: Every day | ORAL | 0 refills | Status: DC

## 2022-12-16 NOTE — Progress Notes (Deleted)
Office Visit Note  Patient: Casey Santana             Date of Birth: January 22, 2001           MRN: [id number]             PCP: Jeri Lager, FNP Referring: Willow Ora, MD Visit Date: 12/29/2022 Occupation: @GUAROCC @  Subjective:  No chief complaint on file.   History of Present Illness: Casey Santana is a 23 y.o. adult ***     Activities of Daily Living:  Patient reports morning stiffness for *** {minute/hour:19697}.   Patient {ACTIONS;DENIES/REPORTS:21021675::"Denies"} nocturnal pain.  Difficulty dressing/grooming: {ACTIONS;DENIES/REPORTS:21021675::"Denies"} Difficulty climbing stairs: {ACTIONS;DENIES/REPORTS:21021675::"Denies"} Difficulty getting out of chair: {ACTIONS;DENIES/REPORTS:21021675::"Denies"} Difficulty using hands for taps, buttons, cutlery, and/or writing: {ACTIONS;DENIES/REPORTS:21021675::"Denies"}  No Rheumatology ROS completed.   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   GAD (generalized anxiety disorder) 05/23/2020   Peanut allergy 03/03/2019   Depression, major, single episode, moderate (HCC) 03/01/2019   Syncope and collapse 03/04/2018    Past Medical History:  Diagnosis Date   Anxiety    Autoimmune disorder (HCC)    Depression    Dysmenorrhea    Heart murmur    Trichotillomania     Family History  Problem Relation Age of Onset   Arthritis Mother    Bipolar disorder Mother    Hyperlipidemia Father    Healthy Brother    Arthritis Maternal Grandmother    Hearing loss Maternal Grandmother    Hyperlipidemia Maternal Grandmother    Hypertension Maternal Grandmother    Anxiety disorder Maternal Grandmother    Arthritis Maternal Grandfather    Cancer Maternal Grandfather     Depression Maternal Grandfather    Hearing loss Maternal Grandfather    Hyperlipidemia Maternal Grandfather    Other Maternal Grandfather        back pain   Hyperlipidemia Paternal Grandmother    Alzheimer's disease Paternal Grandmother    Hyperlipidemia Paternal Grandfather    Bipolar disorder Other    Schizophrenia Other    Migraines Neg Hx    Seizures Neg Hx    Autism Neg Hx    ADD / ADHD Neg Hx    Past Surgical History:  Procedure Laterality Date   NO PAST SURGERIES     WISDOM TOOTH EXTRACTION     Social History   Social History Narrative   ** Merged History Encounter **       Lives with mom, dad and brother when he comes home from college. She plans on attending [employer]   Immunization History  Administered Date(s) Administered   DTaP 02/28/2001, 05/05/2001, 07/13/2001, 05/19/2002, 01/16/2005   H1N1 04/06/2008   HIB (PRP-OMP) 02/28/2001, 05/05/2001, 07/13/2001, 05/19/2002   HPV 9-valent 11/08/2014, 01/25/2017   Hepatitis A 01/16/2005, 07/17/2005   Hepatitis B 02/11/01, 02/28/2001, 11/03/2001   IPV 02/28/2001, 05/05/2001, 11/03/2001, 01/16/2005   Influenza,inj,Quad PF,6+ Mos 11/27/2016, 02/13/2019, 12/27/2019   Influenza-Unspecified 01/05/2007, 01/22/2011, 12/31/2011, 12/15/2015   MMR 01/04/2002, 01/16/2005   Meningococcal B, OMV 02/24/2018, 09/02/2018   Meningococcal Conjugate 11/08/2014   Meningococcal Mcv4o 02/24/2018   PFIZER(Purple Top)SARS-COV-2 Vaccination 06/30/2019, 07/24/2019, 02/07/2020   Pneumococcal Conjugate-13 02/28/2001, 05/05/2001, 07/13/2001, 12/15/2002   Tdap 09/25/2011   Varicella 01/04/2002, 01/16/2005     Objective: Vital Signs:  There were no vitals taken for this visit.   Physical Exam   Musculoskeletal Exam: ***  CDAI Exam: CDAI Score: -- Patient Global: --; Provider Global: -- Swollen: --; Tender: -- Joint Exam 12/29/2022   No joint exam has been documented for this visit   There is currently no information documented on  the homunculus. Go to the Rheumatology activity and complete the homunculus joint exam.  Investigation: No additional findings.  Imaging: No results found.  Recent Labs: Lab Results  Component Value Date   WBC 5.4 07/11/2020   HGB 13.0 07/11/2020   PLT 207 07/11/2020   NA 139 07/11/2020   K 3.4 (L) 07/11/2020   CL 106 07/11/2020   CO2 25 07/11/2020   GLUCOSE 100 (H) 07/11/2020   BUN 8 07/11/2020   CREATININE 0.68 07/11/2020   CALCIUM 9.9 07/11/2020    Speciality Comments: No specialty comments available.  Procedures:  No procedures performed Allergies: Avocado, Peanut allergen powder-dnfp, and Peanut-containing drug products   Assessment / Plan:     Visit Diagnoses: No diagnosis found.  Orders: No orders of the defined types were placed in this encounter.  No orders of the defined types were placed in this encounter.   Face-to-face time spent with patient was *** minutes. Greater than 50% of time was spent in counseling and coordination of care.  Follow-Up Instructions: No follow-ups on file.   Ellen Henri, CMA  Note - This record has been created using Animal nutritionist.  Chart creation errors have been sought, but may not always  have been located. Such creation errors do not reflect on  the standard of medical care.

## 2022-12-24 ENCOUNTER — Ambulatory Visit: Payer: Self-pay | Admitting: Cardiology

## 2022-12-27 ENCOUNTER — Encounter: Payer: Self-pay | Admitting: Rheumatology

## 2022-12-29 ENCOUNTER — Ambulatory Visit: Payer: BC Managed Care – PPO | Admitting: Rheumatology

## 2022-12-29 DIAGNOSIS — F32A Depression, unspecified: Secondary | ICD-10-CM

## 2022-12-29 DIAGNOSIS — E739 Lactose intolerance, unspecified: Secondary | ICD-10-CM

## 2022-12-29 DIAGNOSIS — Z8719 Personal history of other diseases of the digestive system: Secondary | ICD-10-CM

## 2022-12-29 DIAGNOSIS — Z975 Presence of (intrauterine) contraceptive device: Secondary | ICD-10-CM

## 2022-12-29 DIAGNOSIS — M7061 Trochanteric bursitis, right hip: Secondary | ICD-10-CM

## 2022-12-29 DIAGNOSIS — M7918 Myalgia, other site: Secondary | ICD-10-CM

## 2022-12-29 DIAGNOSIS — Z91010 Allergy to peanuts: Secondary | ICD-10-CM

## 2022-12-29 DIAGNOSIS — Z1589 Genetic susceptibility to other disease: Secondary | ICD-10-CM

## 2022-12-29 DIAGNOSIS — G8929 Other chronic pain: Secondary | ICD-10-CM

## 2022-12-29 DIAGNOSIS — R42 Dizziness and giddiness: Secondary | ICD-10-CM

## 2022-12-29 DIAGNOSIS — F988 Other specified behavioral and emotional disorders with onset usually occurring in childhood and adolescence: Secondary | ICD-10-CM

## 2022-12-29 DIAGNOSIS — M2550 Pain in unspecified joint: Secondary | ICD-10-CM

## 2022-12-29 DIAGNOSIS — R768 Other specified abnormal immunological findings in serum: Secondary | ICD-10-CM

## 2023-01-08 ENCOUNTER — Telehealth: Payer: Self-pay | Admitting: Adult Health

## 2023-01-08 ENCOUNTER — Other Ambulatory Visit: Payer: Self-pay

## 2023-01-08 DIAGNOSIS — F909 Attention-deficit hyperactivity disorder, unspecified type: Secondary | ICD-10-CM

## 2023-01-08 MED ORDER — AMPHETAMINE-DEXTROAMPHETAMINE 30 MG PO TABS: 30.0000 mg | ORAL_TABLET | Freq: Every day | ORAL | 0 refills | Status: DC

## 2023-01-08 NOTE — Telephone Encounter (Signed)
PT lvm that she needs a refill on her adderall 30 mg. [facility] [address] in [place]

## 2023-01-08 NOTE — Telephone Encounter (Signed)
Pended.

## 2023-02-08 ENCOUNTER — Other Ambulatory Visit: Payer: Self-pay

## 2023-02-08 ENCOUNTER — Telehealth: Payer: Self-pay | Admitting: Adult Health

## 2023-02-08 DIAGNOSIS — F909 Attention-deficit hyperactivity disorder, unspecified type: Secondary | ICD-10-CM

## 2023-02-08 MED ORDER — AMPHETAMINE-DEXTROAMPHETAMINE 30 MG PO TABS
30.0000 mg | ORAL_TABLET | Freq: Every day | ORAL | 0 refills | Status: DC
Start: 2023-02-08 — End: 2023-02-10

## 2023-02-08 NOTE — Telephone Encounter (Signed)
PENDED ADDERALL 30 MG TO REQUESTED PHARMACY.

## 2023-02-08 NOTE — Telephone Encounter (Signed)
Pt LVM @ 10:15a requesting refill of Adderall 30mg  to   [facility] [id number] [facility], [place] - [address] [place], [place] [address] Phone: [phone]  Fax: [phone]    Next appt 11/27

## 2023-02-10 ENCOUNTER — Encounter: Payer: Self-pay | Admitting: Adult Health

## 2023-02-10 ENCOUNTER — Ambulatory Visit (INDEPENDENT_AMBULATORY_CARE_PROVIDER_SITE_OTHER): Payer: BC Managed Care – PPO | Admitting: Adult Health

## 2023-02-10 DIAGNOSIS — F909 Attention-deficit hyperactivity disorder, unspecified type: Secondary | ICD-10-CM

## 2023-02-10 DIAGNOSIS — F411 Generalized anxiety disorder: Secondary | ICD-10-CM | POA: Diagnosis not present

## 2023-02-10 MED ORDER — AMPHETAMINE-DEXTROAMPHETAMINE 30 MG PO TABS
30.0000 mg | ORAL_TABLET | Freq: Every day | ORAL | 0 refills | Status: DC
Start: 2023-04-07 — End: 2023-05-12

## 2023-02-10 MED ORDER — AMPHETAMINE-DEXTROAMPHETAMINE 30 MG PO TABS: 30.0000 mg | ORAL_TABLET | Freq: Every day | ORAL | 0 refills | Status: DC

## 2023-02-10 MED ORDER — AMPHETAMINE-DEXTROAMPHETAMINE 30 MG PO TABS
30.0000 mg | ORAL_TABLET | Freq: Every day | ORAL | 0 refills | Status: DC
Start: 2023-03-10 — End: 2023-03-12

## 2023-02-10 MED ORDER — PROPRANOLOL HCL 10 MG PO TABS
10.0000 mg | ORAL_TABLET | Freq: Two times a day (BID) | ORAL | 5 refills | Status: DC
Start: 2023-02-10 — End: 2023-05-12

## 2023-02-10 NOTE — Progress Notes (Signed)
Casey Santana [id number] [birth year] 22 y.o.  Subjective:   Patient ID:  Casey Santana is a 22 y.o. (DOB 10/23/2000) adult.  Chief Complaint: No chief complaint on file.   HPI Casey Santana presents to the office today for follow-up of MDD, ADHD, and GAD.  Describes mood today as "ok". Pleasant. Denies tearful. Mood symptoms - reports some anxiety, irritability and depression - recent move. Denies panic attacks. Reports some worry, rumination, and over thinking with recent move. Mood is consistent. Stating "I feel like I'm doing alright". Feels like current medications are helpful. Working with therapist - telehealth. Improved interest and motivation. Taking medications as prescribed.  Energy levels stable. Active, has a regular exercise program.   Enjoys some usual interests and activities. Lives in [place] with boyfriend and cat "Mellow". Parents in [place]. Has a brother - living in [place]. Spending time with family.  Appetite adequate. Weight stable - 126 pounds. Sleeps well most nights. Averages 6 to 7.5 hours. Focus and concentration "ok". Completing tasks. Managing aspects of household. Attending [employer]. Denies SI or HI.  Denies AH or VH.  Denies self harm.  Denies substance use.  Previous medication trials: Lexapro, Prozac, Propanolol, Cymbalta, Wellbutrin XL, Abilify, Pristiq   GAD-7    Flowsheet Row Office Visit from 02/13/2019 in [facility] [place] [employer] Visit from 02/24/2018 in [facility] at [facility]  Total GAD-7 Score 14 9      PHQ2-9    Flowsheet Row Office Visit from 07/14/2022 in [facility] at [place] Office Visit from 12/16/2020 in [facility] for [facility] at [place] Office Visit from 06/19/2020 in [facility] [place] [employer] Visit from 05/12/2019 in [facility] [place] [employer] Visit from 04/27/2019 in [facility] [place]  PHQ-2 Total Score 0 0 0 4 4  PHQ-9 Total Score -- -- -- 19 20      Flowsheet Row ED from 07/11/2020 in [facility] at [facility] Visit from 02/13/2019 in [facility] [place]  C-SSRS RISK CATEGORY No Risk Error: Question 6 not populated        Review of Systems:  Review of Systems  Musculoskeletal:  Negative for gait problem.  Neurological:  Negative for tremors.  Psychiatric/Behavioral:         Please refer to HPI    Medications: I have reviewed the patient's current medications.  Current Outpatient Medications  Medication Sig Dispense Refill   amphetamine-dextroamphetamine (ADDERALL) 30 MG tablet Take 1 tablet by mouth daily. 30 tablet 0   EPINEPHrine (EPIPEN 2-PAK) 0.3 mg/0.3 mL IJ SOAJ injection Inject 0.3 mg into the muscle as needed for anaphylaxis. 1 each 2   etonogestrel (NEXPLANON) 68 MG IMPL implant 1 each by Subdermal route once.     ibuprofen (ADVIL) 800 MG tablet Take 1 tablet (800 mg total) by mouth every 8 (eight) hours as needed for pain 60 tablet 3   metoprolol tartrate (LOPRESSOR) 25 MG tablet Take 1 tablet (25 mg total) by mouth 2 (two) times daily. 60 tablet 3   No current facility-administered medications for this visit.    Medication Side Effects: None  Allergies:  Allergies  Allergen Reactions   Avocado    Peanut Allergen Powder-Dnfp    Peanut-Containing Drug Products     Past Medical History:  Diagnosis Date   Anxiety    Autoimmune disorder (HCC)    Depression    Dysmenorrhea    Heart murmur    Trichotillomania     Past Medical History, Surgical history, Social history, and Family history were reviewed and updated as appropriate.   Please see review of systems for further details on the patient's review from today.   Objective:   Physical Exam:  There were no vitals taken for this  visit.  Physical Exam Constitutional:      General: She is not in acute distress. Musculoskeletal:        General: No deformity.  Neurological:     Mental Status: She is alert and oriented to person, place, and time.     Coordination: Coordination normal.  Psychiatric:        Attention and Perception: Attention and perception normal. She does not perceive auditory or visual hallucinations.        Mood and Affect: Mood normal. Mood is not anxious or depressed. Affect is not labile, blunt, angry or inappropriate.        Speech: Speech normal.        Behavior: Behavior normal.        Thought Content: Thought content normal. Thought content is not paranoid or delusional. Thought content does not include homicidal or suicidal ideation. Thought content does not include homicidal or suicidal plan.        Cognition and Memory: Cognition and memory normal.        Judgment: Judgment normal.     Comments: Insight intact     Lab Review:     Component Value Date/Time   NA 139 07/11/2020 1738   K 3.4 (L) 07/11/2020 1738   CL 106 07/11/2020 1738   CO2 25 07/11/2020 1738   GLUCOSE 100 (H) 07/11/2020 1738   BUN 8 07/11/2020 1738   CREATININE 0.68 07/11/2020 1738   CALCIUM 9.9 07/11/2020 1738   GFRNONAA 128 02/23/2022 1511       Component Value Date/Time   WBC 5.4 07/11/2020 1738   RBC 4.24 07/11/2020 1738   HGB 13.0 07/11/2020 1738   HCT 37.8 07/11/2020 1738   PLT 207 07/11/2020 1738   MCV 89.2 07/11/2020 1738   MCH 30.7 07/11/2020 1738   MCHC 34.4 07/11/2020 1738   RDW 12.0 07/11/2020 1738   LYMPHSABS 2.2 05/23/2020 0828   MONOABS 0.4 05/23/2020 0828   EOSABS 0.2 05/23/2020 0828   BASOSABS 0.1 05/23/2020 0828    No results found for: "POCLITH", "LITHIUM"   No results found for: "PHENYTOIN", "PHENOBARB", "VALPROATE", "CBMZ"   .res Assessment: Plan:    Plan:  PDMP reviewed  1. Adderall 30mg  daily. 2. Propanolol 10mg  BID for anxiety  117/79/82  Monitor BP between  visits while taking stimulant medication.   40/58 Attention Deficit Disorder likely  Meets DSM 5 criteria for ADHD  RTC 3 months  Patient advised to contact office with any questions, adverse effects, or acute worsening in signs and symptoms.  Discussed potential benefits, risks, and side effects of stimulants with patient to include increased heart rate, palpitations, insomnia, increased anxiety, increased irritability, or decreased appetite.  Instructed patient to contact office if experiencing any significant tolerability issues.  There are no diagnoses linked to this encounter.   Please see After Visit Summary for patient specific instructions.  Future Appointments  Date Time Provider Department Center  02/10/2023  5:00 PM Evin Loiseau, Thereasa Solo, NP [facility] None    No orders  of the defined types were placed in this encounter.   -------------------------------

## 2023-03-12 ENCOUNTER — Other Ambulatory Visit: Payer: Self-pay

## 2023-03-12 ENCOUNTER — Telehealth: Payer: Self-pay | Admitting: Adult Health

## 2023-03-12 ENCOUNTER — Other Ambulatory Visit (HOSPITAL_COMMUNITY): Payer: Self-pay

## 2023-03-12 DIAGNOSIS — F909 Attention-deficit hyperactivity disorder, unspecified type: Secondary | ICD-10-CM

## 2023-03-12 MED ORDER — AMPHETAMINE-DEXTROAMPHETAMINE 30 MG PO TABS
30.0000 mg | ORAL_TABLET | Freq: Every day | ORAL | 0 refills | Status: DC
Start: 2023-03-12 — End: 2023-05-12
  Filled 2023-03-12: qty 30, 30d supply, fill #0

## 2023-03-12 NOTE — Telephone Encounter (Signed)
Pt LVM on 12/26@ 10:40a requesting refill of Adderall to [facility] since she is in [place] for the holiday.  Ok to leave the Jan script in [place].  Next appt 2/26

## 2023-03-12 NOTE — Telephone Encounter (Signed)
Pended adderall 30 mg to requested pharmacy

## 2023-04-12 ENCOUNTER — Telehealth: Payer: Self-pay | Admitting: Adult Health

## 2023-04-12 NOTE — Telephone Encounter (Signed)
Pt aware of Rx.

## 2023-04-12 NOTE — Telephone Encounter (Signed)
Casey Santana called at 2:10 to request refill of her Adderall 30mg .  She already has one at the pharmacy and she was told to call the phamacy

## 2023-05-12 ENCOUNTER — Telehealth: Payer: 59 | Admitting: Adult Health

## 2023-05-12 ENCOUNTER — Encounter: Payer: Self-pay | Admitting: Adult Health

## 2023-05-12 DIAGNOSIS — F909 Attention-deficit hyperactivity disorder, unspecified type: Secondary | ICD-10-CM | POA: Diagnosis not present

## 2023-05-12 DIAGNOSIS — F411 Generalized anxiety disorder: Secondary | ICD-10-CM | POA: Diagnosis not present

## 2023-05-12 DIAGNOSIS — F331 Major depressive disorder, recurrent, moderate: Secondary | ICD-10-CM

## 2023-05-12 MED ORDER — AMPHETAMINE-DEXTROAMPHETAMINE 30 MG PO TABS: 30.0000 mg | ORAL_TABLET | Freq: Every day | ORAL | 0 refills | Status: DC

## 2023-05-12 MED ORDER — AMPHETAMINE-DEXTROAMPHETAMINE 30 MG PO TABS
30.0000 mg | ORAL_TABLET | Freq: Every day | ORAL | 0 refills | Status: DC
Start: 2023-07-07 — End: 2023-08-10

## 2023-05-12 MED ORDER — PROPRANOLOL HCL 10 MG PO TABS
10.0000 mg | ORAL_TABLET | Freq: Two times a day (BID) | ORAL | 1 refills | Status: DC
Start: 2023-05-12 — End: 2023-11-24

## 2023-05-12 NOTE — Progress Notes (Signed)
 Casey Santana [id number] 04/26/2000 22 y.o.  Virtual Visit via Video Note  I connected with pt @ on 05/12/23 at  4:00 PM EST by a video enabled telemedicine application and verified that I am speaking with the correct person using two identifiers.   I discussed the limitations of evaluation and management by telemedicine and the availability of in person appointments. The patient expressed understanding and agreed to proceed.  I discussed the assessment and treatment plan with the patient. The patient was provided an opportunity to ask questions and all were answered. The patient agreed with the plan and demonstrated an understanding of the instructions.   The patient was advised to call back or seek an in-person evaluation if the symptoms worsen or if the condition fails to improve as anticipated.  I provided 15 minutes of non-face-to-face time during this encounter.  The patient was located at home.  The provider was located at [facility].   Dorothyann Gibbs, NP   Subjective:   Patient ID:  Casey Santana is a 23 y.o. (DOB 2000-08-10) adult.  Chief Complaint: No chief complaint on file.   HPI Casey Santana presents for follow-up of MDD, ADHD, and GAD.  Describes mood today as "ok". Pleasant. Denies tearful. Mood symptoms - reports some anxiety - school work. Denies irritability and depression. Reports improved interest and motivation. Denies recent panic attacks. Denies worry and rumination. Reports over thinking  Mood is stable. Stating "I feel like I'm doing ok". Taking medications as prescribed.  Energy levels stable. Active, has a regular exercise routine - walking 2 miles a day. Enjoys some usual interests and activities. Lives in [place] with boyfriend and cat "Mellow". Parents in [place]. Has a brother - living in [place]. Spending time with family.  Appetite adequate. Weight gain - 135 to 140 pounds. Sleeps well most nights. Averages 6 to 7.5  hours. Focus and concentration "better than it has been in the past". Completing tasks. Managing aspects of household. Attending [employer] classes. Denies SI or HI.  Denies AH or VH.  Denies self harm.  Denies substance use.  Previous medication trials: Lexapro, Prozac, Propanolol, Cymbalta, Wellbutrin XL, Abilify, Pristiq    Review of Systems:  Review of Systems  Musculoskeletal:  Negative for gait problem.  Neurological:  Negative for tremors.  Psychiatric/Behavioral:         Please refer to HPI    Medications: I have reviewed the patient's current medications.  Current Outpatient Medications  Medication Sig Dispense Refill   amphetamine-dextroamphetamine (ADDERALL) 30 MG tablet Take 1 tablet by mouth daily. 30 tablet 0   amphetamine-dextroamphetamine (ADDERALL) 30 MG tablet Take 1 tablet by mouth daily. 30 tablet 0   amphetamine-dextroamphetamine (ADDERALL) 30 MG tablet Take 1 tablet by mouth daily. 30 tablet 0   EPINEPHrine (EPIPEN 2-PAK) 0.3 mg/0.3 mL IJ SOAJ injection Inject 0.3 mg into the muscle as needed for anaphylaxis. 1 each 2   etonogestrel (NEXPLANON) 68 MG IMPL implant 1 each by Subdermal route once.     ibuprofen (ADVIL) 800 MG tablet Take 1 tablet (800 mg total) by mouth every 8 (eight) hours as needed for pain 60 tablet 3   metoprolol tartrate (LOPRESSOR) 25 MG tablet Take 1 tablet (25 mg total) by mouth 2 (two) times daily. 60 tablet 3   propranolol (INDERAL) 10 MG tablet Take 1 tablet (10 mg total) by mouth 2 (two) times daily. 60 tablet 5   No current facility-administered medications for this  visit.    Medication Side Effects: None  Allergies:  Allergies  Allergen Reactions   Avocado    Peanut Allergen Powder-Dnfp    Peanut-Containing Drug Products     Past Medical History:  Diagnosis Date   Anxiety    Autoimmune disorder (HCC)    Depression    Dysmenorrhea    Heart murmur    Trichotillomania     Family History  Problem Relation Age of  Onset   Arthritis Mother    Bipolar disorder Mother    Hyperlipidemia Father    Healthy Brother    Arthritis Maternal Grandmother    Hearing loss Maternal Grandmother    Hyperlipidemia Maternal Grandmother    Hypertension Maternal Grandmother    Anxiety disorder Maternal Grandmother    Arthritis Maternal Grandfather    Cancer Maternal Grandfather    Depression Maternal Grandfather    Hearing loss Maternal Grandfather    Hyperlipidemia Maternal Grandfather    Other Maternal Grandfather        back pain   Hyperlipidemia Paternal Grandmother    Alzheimer's disease Paternal Grandmother    Hyperlipidemia Paternal Grandfather    Bipolar disorder Other    Schizophrenia Other    Migraines Neg Hx    Seizures Neg Hx    Autism Neg Hx    ADD / ADHD Neg Hx     Social History   Socioeconomic History   Marital status: Single    Spouse name: Not on file   Number of children: Not on file   Years of education: Not on file   Highest education level: Not on file  Occupational History   Not on file  Tobacco Use   Smoking status: Never    Passive exposure: Never   Smokeless tobacco: Never  Vaping Use   Vaping status: Never Used  Substance and Sexual Activity   Alcohol use: No   Drug use: No   Sexual activity: Yes    Birth control/protection: Implant  Other Topics Concern   Not on file  Social History Narrative   ** Merged History Encounter **       Lives with mom, dad and brother when he comes home from college. She plans on attending [employer]   Social Drivers of Health   Financial Resource Strain: Not on file  Food Insecurity: Not on file  Transportation Needs: Not on file  Physical Activity: Not on file  Stress: Not on file  Social Connections: Not on file  Intimate Partner Violence: Not on file    Past Medical History, Surgical history, Social history, and Family history were reviewed and updated as appropriate.   Please see review of systems for further details  on the patient's review from today.   Objective:   Physical Exam:  There were no vitals taken for this visit.  Physical Exam Constitutional:      General: She is not in acute distress. Musculoskeletal:        General: No deformity.  Neurological:     Mental Status: She is alert and oriented to person, place, and time.     Coordination: Coordination normal.  Psychiatric:        Attention and Perception: Attention and perception normal. She does not perceive auditory or visual hallucinations.        Mood and Affect: Affect is not labile, blunt, angry or inappropriate.        Speech: Speech normal.        Behavior: Behavior normal.  Thought Content: Thought content normal. Thought content is not paranoid or delusional. Thought content does not include homicidal or suicidal ideation. Thought content does not include homicidal or suicidal plan.        Cognition and Memory: Cognition and memory normal.        Judgment: Judgment normal.     Comments: Insight intact     Lab Review:     Component Value Date/Time   NA 139 07/11/2020 1738   K 3.4 (L) 07/11/2020 1738   CL 106 07/11/2020 1738   CO2 25 07/11/2020 1738   GLUCOSE 100 (H) 07/11/2020 1738   BUN 8 07/11/2020 1738   CREATININE 0.68 07/11/2020 1738   CALCIUM 9.9 07/11/2020 1738   GFRNONAA 128 02/23/2022 1511       Component Value Date/Time   WBC 5.4 07/11/2020 1738   RBC 4.24 07/11/2020 1738   HGB 13.0 07/11/2020 1738   HCT 37.8 07/11/2020 1738   PLT 207 07/11/2020 1738   MCV 89.2 07/11/2020 1738   MCH 30.7 07/11/2020 1738   MCHC 34.4 07/11/2020 1738   RDW 12.0 07/11/2020 1738   LYMPHSABS 2.2 05/23/2020 0828   MONOABS 0.4 05/23/2020 0828   EOSABS 0.2 05/23/2020 0828   BASOSABS 0.1 05/23/2020 0828    No results found for: "POCLITH", "LITHIUM"   No results found for: "PHENYTOIN", "PHENOBARB", "VALPROATE", "CBMZ"   .res Assessment: Plan:    Plan:  PDMP reviewed  1. Adderall 30mg  daily. 2.  Propanolol 10mg  BID for anxiety  117/79/82  Monitor BP between visits while taking stimulant medication.   40/58 Attention Deficit Disorder likely  Meets DSM 5 criteria for ADHD  RTC 3 months  15 minutes spent dedicated to the care of this patient on the date of this encounter to include pre-visit review of records, ordering of medication, post visit documentation, and face-to-face time with the patient discussing MDD, ADHD, and GAD. Discussed continuing current medication regimen.  Patient advised to contact office with any questions, adverse effects, or acute worsening in signs and symptoms.  Discussed potential benefits, risks, and side effects of stimulants with patient to include increased heart rate, palpitations, insomnia, increased anxiety, increased irritability, or decreased appetite.  Instructed patient to contact office if experiencing any significant tolerability issues.   Diagnoses and all orders for this visit:  Major depressive disorder, recurrent episode, moderate (HCC)  Generalized anxiety disorder  Attention deficit hyperactivity disorder (ADHD), unspecified ADHD type     Please see After Visit Summary for patient specific instructions.  Future Appointments  Date Time Provider Department Center  05/12/2023  4:00 PM Wells Gerdeman, Thereasa Solo, NP CP-[facility] None  07/30/2023 10:35 AM Lo, Toma Aran, CNM [facility]    No orders of the defined types were placed in this encounter.     -------------------------------

## 2023-07-02 ENCOUNTER — Ambulatory Visit (HOSPITAL_BASED_OUTPATIENT_CLINIC_OR_DEPARTMENT_OTHER): Payer: BC Managed Care – PPO | Admitting: Certified Nurse Midwife

## 2023-07-30 ENCOUNTER — Ambulatory Visit (HOSPITAL_BASED_OUTPATIENT_CLINIC_OR_DEPARTMENT_OTHER): Payer: Self-pay | Admitting: Certified Nurse Midwife

## 2023-08-03 ENCOUNTER — Encounter (HOSPITAL_BASED_OUTPATIENT_CLINIC_OR_DEPARTMENT_OTHER): Payer: Self-pay | Admitting: Certified Nurse Midwife

## 2023-08-03 ENCOUNTER — Ambulatory Visit (HOSPITAL_BASED_OUTPATIENT_CLINIC_OR_DEPARTMENT_OTHER): Admitting: Certified Nurse Midwife

## 2023-08-03 VITALS — BP 124/80 | HR 86 | Ht 64.0 in | Wt 138.6 lb

## 2023-08-03 DIAGNOSIS — Z3046 Encounter for surveillance of implantable subdermal contraceptive: Secondary | ICD-10-CM

## 2023-08-03 DIAGNOSIS — K644 Residual hemorrhoidal skin tags: Secondary | ICD-10-CM

## 2023-08-03 DIAGNOSIS — Z30017 Encounter for initial prescription of implantable subdermal contraceptive: Secondary | ICD-10-CM | POA: Insufficient documentation

## 2023-08-03 MED ORDER — ETONOGESTREL 68 MG ~~LOC~~ IMPL
68.0000 mg | DRUG_IMPLANT | Freq: Once | SUBCUTANEOUS | Status: AC
Start: 2023-08-03 — End: 2023-08-03
  Administered 2023-08-03: 68 mg via SUBCUTANEOUS

## 2023-08-03 NOTE — Addendum Note (Signed)
 Addended by: Arnetta Bianchi B on: 08/03/2023 05:03 PM   Modules accepted: Orders

## 2023-08-03 NOTE — Progress Notes (Signed)
 23 y.o. G0P0000 Single White or Caucasian  here for annual exam.  Micah attends [employer] and graduates Dec 2026. Accompanied by her supportive boyfriend. Pt desires removal of Nexplanon  and insertion new Nexplanon  same site. Gardasil vaccine series completed. Pt doing well emotionally. Periods have been regular w/ nexplanon . No concerns for pregnancy or STI exposure. Pt states she often experiences constipation or diarrhea. She sometimes feels pain/itchy at rectal area after passing a BM.   Patient's last menstrual period was 07/23/2023.          Sexually active: No.  The current method of family planning is Nexplanon .     Exercising: Yes.     Smoker:  no  Health Maintenance: Pap:  07/14/22 Negative History of abnormal Pap:  no Gardasil Vaccines: Complete PCP: Pamula Boga FNP, labs UTD   reports that she has never smoked. She has never been exposed to tobacco smoke. She has never used smokeless tobacco. She reports that she does not drink alcohol and does not use drugs.  Past Medical History:  Diagnosis Date   Anxiety    Autoimmune disorder (HCC)    Depression    Dysmenorrhea    Heart murmur    Trichotillomania     Past Surgical History:  Procedure Laterality Date   NO PAST SURGERIES     WISDOM TOOTH EXTRACTION      Current Outpatient Medications  Medication Sig Dispense Refill   amphetamine -dextroamphetamine  (ADDERALL) 30 MG tablet Take 1 tablet by mouth daily. 30 tablet 0   amphetamine -dextroamphetamine  (ADDERALL) 30 MG tablet Take 1 tablet by mouth daily. 30 tablet 0   amphetamine -dextroamphetamine  (ADDERALL) 30 MG tablet Take 1 tablet by mouth daily. 30 tablet 0   EPINEPHrine  (EPIPEN  2-PAK) 0.3 mg/0.3 mL IJ SOAJ injection Inject 0.3 mg into the muscle as needed for anaphylaxis. 1 each 2   etonogestrel  (NEXPLANON ) 68 MG IMPL implant 1 each by Subdermal route once.     ibuprofen  (ADVIL ) 800 MG tablet Take 1 tablet (800 mg total) by mouth every 8  (eight) hours as needed for pain 60 tablet 3   propranolol  (INDERAL ) 10 MG tablet Take 1 tablet (10 mg total) by mouth 2 (two) times daily. 180 tablet 1   metoprolol  tartrate (LOPRESSOR ) 25 MG tablet Take 1 tablet (25 mg total) by mouth 2 (two) times daily. 60 tablet 3   No current facility-administered medications for this visit.    Family History  Problem Relation Age of Onset   Arthritis Mother    Bipolar disorder Mother    Hyperlipidemia Father    Healthy Brother    Arthritis Maternal Grandmother    Hearing loss Maternal Grandmother    Hyperlipidemia Maternal Grandmother    Hypertension Maternal Grandmother    Anxiety disorder Maternal Grandmother    Arthritis Maternal Grandfather    Cancer Maternal Grandfather    Depression Maternal Grandfather    Hearing loss Maternal Grandfather    Hyperlipidemia Maternal Grandfather    Other Maternal Grandfather        back pain   Hyperlipidemia Paternal Grandmother    Alzheimer's disease Paternal Grandmother    Hyperlipidemia Paternal Grandfather    Bipolar disorder Other    Schizophrenia Other    Migraines Neg Hx    Seizures Neg Hx    Autism Neg Hx    ADD / ADHD Neg Hx     ROS: Constitutional: negative Genitourinary:negative  Exam:   BP 124/80 (BP Location: Left Arm, Patient  Position: Sitting, Cuff Size: Large)   Pulse 86   Ht 5\' 4"  (1.626 m) Comment: Reported  Wt 138 lb 9.6 oz (62.9 kg)   LMP 07/23/2023   BMI 23.79 kg/m   Height: 5\' 4"  (162.6 cm) (Reported)  General appearance: alert, cooperative and appears stated age Head: Normocephalic, without obvious abnormality, atraumatic Lungs: clear to auscultation bilaterally Breasts: normal appearance, no masses or tenderness, Inspection negative, No nipple retraction or dimpling, No nipple discharge or bleeding, No axillary or supraclavicular adenopathy, Normal to palpation without dominant masses Heart: regular rate and rhythm Abdomen: soft, non-tender; bowel sounds  normal; no masses,  no organomegaly Extremities: extremities normal, atraumatic, no cyanosis or edema Skin: Skin color, texture, turgor normal. No rashes or lesions. Nexplanon  palpable left arm.  Lymph nodes: Cervical, supraclavicular, and axillary nodes normal. No abnormal inguinal nodes palpated Neurologic: Grossly normal Vulva: Normal Rectum: One small hemorrhoid noted 12:00  Assessment/Plan:  1. Encounter for removal and new insertion subdermal contraceptive (Primary)  GYNECOLOGY CLINIC PROCEDURE NOTE  Alizeh E Nuon is a 23 y.o. G0P0000 here for Nexplanon  removal and Nexplanon  insertion.   No other gynecologic concerns.  Nexplanon  Removal and Insertion  Patient identified, informed consent performed, consent signed.   Patient does understand that irregular bleeding is a very common side effect of this medication. She was advised to have backup contraception for one week after replacement of the implant. Pregnancy test in clinic today was negative.  Appropriate time out taken. Implanon  site identified. Area prepped in usual sterile fashon. One ml of 1% lidocaine was used to anesthetize the area at the distal end of the implant. A small stab incision was made right beside the implant on the distal portion. The Nexplanon  rod was grasped using hemostats and removed without difficulty. There was minimal blood loss. There were no complications. Area was then injected with 3 ml of 1 % lidocaine. She was re-prepped with betadine, Nexplanon  removed from packaging, Device confirmed in needle, then inserted full length of needle and withdrawn per handbook instructions. Nexplanon  was able to palpated in the patient's arm; patient palpated the insert herself.  There was minimal blood loss. Patient insertion site covered with guaze and a pressure bandage to reduce any bruising. The patient tolerated the procedure well and was given post procedure instructions.  She was advised to have backup contraception  for one week.    2. External Non-Thrombosed Hemorrhoid - Pt encouraged to try OTC Hemorrhoidal Cream  RTO 1 year for annual gyn exam and prn if issues arise.  Yolanda Hence, CNM 11:34 AM

## 2023-08-10 ENCOUNTER — Telehealth (INDEPENDENT_AMBULATORY_CARE_PROVIDER_SITE_OTHER): Payer: 59 | Admitting: Adult Health

## 2023-08-10 ENCOUNTER — Encounter: Payer: Self-pay | Admitting: Adult Health

## 2023-08-10 DIAGNOSIS — F411 Generalized anxiety disorder: Secondary | ICD-10-CM | POA: Diagnosis not present

## 2023-08-10 DIAGNOSIS — F329 Major depressive disorder, single episode, unspecified: Secondary | ICD-10-CM | POA: Diagnosis not present

## 2023-08-10 DIAGNOSIS — F331 Major depressive disorder, recurrent, moderate: Secondary | ICD-10-CM

## 2023-08-10 DIAGNOSIS — F909 Attention-deficit hyperactivity disorder, unspecified type: Secondary | ICD-10-CM | POA: Diagnosis not present

## 2023-08-10 MED ORDER — AMPHETAMINE-DEXTROAMPHETAMINE 30 MG PO TABS
30.0000 mg | ORAL_TABLET | Freq: Every day | ORAL | 0 refills | Status: DC
Start: 2023-08-10 — End: 2023-11-10

## 2023-08-10 MED ORDER — AMPHETAMINE-DEXTROAMPHETAMINE 30 MG PO TABS: 30.0000 mg | ORAL_TABLET | Freq: Every day | ORAL | 0 refills | Status: DC

## 2023-08-10 MED ORDER — AMPHETAMINE-DEXTROAMPHETAMINE 30 MG PO TABS
30.0000 mg | ORAL_TABLET | Freq: Every day | ORAL | 0 refills | Status: DC
Start: 2023-09-07 — End: 2023-11-24

## 2023-08-10 NOTE — Progress Notes (Signed)
 Casey Santana [id number] 07/03/2000 22 y.o.  Virtual Visit via Video Note  I connected with pt @ on 08/10/23 at  2:00 PM EDT by a video enabled telemedicine application and verified that I am speaking with the correct person using two identifiers.   I discussed the limitations of evaluation and management by telemedicine and the availability of in person appointments. The patient expressed understanding and agreed to proceed.  I discussed the assessment and treatment plan with the patient. The patient was provided an opportunity to ask questions and all were answered. The patient agreed with the plan and demonstrated an understanding of the instructions.   The patient was advised to call back or seek an in-person evaluation if the symptoms worsen or if the condition fails to improve as anticipated.  I provided 20 minutes of non-face-to-face time during this encounter.  The patient was located at home.  The provider was located at [facility].   Reagan Camera, NP   Subjective:   Patient ID:  Casey Santana is a 23 y.o. (DOB 18-Sep-2000) adult.  Chief Complaint: No chief complaint on file.   HPI Casey Santana presents for follow-up of MDD, ADHD and GAD.  Describes mood today as "ok". Pleasant. Denies tearful. Mood symptoms - denies anxiety, irritability and depression. Reports stable interest and motivation. Denies recent panic attacks. Denies worry, rumination and over thinking. Reports mood is stable. Stating "I feel like I'm doing alright". Taking medications as prescribed.  Energy levels stable. Active, does not have a regular exercise routine. Enjoys some usual interests and activities. Lives in [place] with boyfriend and cat "Mellow". Parents in [place]. Has a brother - living in [place] . Spending time with family.  Appetite adequate. Weight gain - 135 to 140 pounds. Sleeps well most nights. Averages 6 to 7.5 hours. Focus and concentration improved.  Completing tasks. Managing aspects of household. Attending [employer]. Denies SI or HI.  Denies AH or VH.  Denies self harm.  Denies substance use.  Previous medication trials: Lexapro , Prozac , Propanolol, Cymbalta , Wellbutrin  XL, Abilify , Pristiq   Review of Systems:  Review of Systems  Musculoskeletal:  Negative for gait problem.  Neurological:  Negative for tremors.  Psychiatric/Behavioral:         Please refer to HPI    Medications: I have reviewed the patient's current medications.  Current Outpatient Medications  Medication Sig Dispense Refill   amphetamine -dextroamphetamine  (ADDERALL) 30 MG tablet Take 1 tablet by mouth daily. 30 tablet 0   amphetamine -dextroamphetamine  (ADDERALL) 30 MG tablet Take 1 tablet by mouth daily. 30 tablet 0   amphetamine -dextroamphetamine  (ADDERALL) 30 MG tablet Take 1 tablet by mouth daily. 30 tablet 0   EPINEPHrine  (EPIPEN  2-PAK) 0.3 mg/0.3 mL IJ SOAJ injection Inject 0.3 mg into the muscle as needed for anaphylaxis. 1 each 2   etonogestrel  (NEXPLANON ) 68 MG IMPL implant 1 each by Subdermal route once.     ibuprofen  (ADVIL ) 800 MG tablet Take 1 tablet (800 mg total) by mouth every 8 (eight) hours as needed for pain 60 tablet 3   metoprolol  tartrate (LOPRESSOR ) 25 MG tablet Take 1 tablet (25 mg total) by mouth 2 (two) times daily. 60 tablet 3   propranolol  (INDERAL ) 10 MG tablet Take 1 tablet (10 mg total) by mouth 2 (two) times daily. 180 tablet 1   No current facility-administered medications for this visit.    Medication Side Effects: None  Allergies:  Allergies  Allergen Reactions   Avocado    Peanut  Allergen Powder-Dnfp    Peanut-Containing Drug Products     Past Medical History:  Diagnosis Date   Anxiety    Autoimmune disorder (HCC)    Depression    Dysmenorrhea    Heart murmur    Trichotillomania     Family History  Problem Relation Age of Onset   Arthritis Mother    Bipolar disorder Mother    Hyperlipidemia Father     Healthy Brother    Arthritis Maternal Grandmother    Hearing loss Maternal Grandmother    Hyperlipidemia Maternal Grandmother    Hypertension Maternal Grandmother    Anxiety disorder Maternal Grandmother    Arthritis Maternal Grandfather    Cancer Maternal Grandfather    Depression Maternal Grandfather    Hearing loss Maternal Grandfather    Hyperlipidemia Maternal Grandfather    Other Maternal Grandfather        back pain   Hyperlipidemia Paternal Grandmother    Alzheimer's disease Paternal Grandmother    Hyperlipidemia Paternal Grandfather    Bipolar disorder Other    Schizophrenia Other    Migraines Neg Hx    Seizures Neg Hx    Autism Neg Hx    ADD / ADHD Neg Hx     Social History   Socioeconomic History   Marital status: Single    Spouse name: Not on file   Number of children: Not on file   Years of education: Not on file   Highest education level: Not on file  Occupational History   Not on file  Tobacco Use   Smoking status: Never    Passive exposure: Never   Smokeless tobacco: Never  Vaping Use   Vaping status: Never Used  Substance and Sexual Activity   Alcohol use: No   Drug use: No   Sexual activity: Yes    Birth control/protection: Implant  Other Topics Concern   Not on file  Social History Narrative   ** Merged History Encounter **       Lives with mom, dad and brother when he comes home from college. She plans on attending [employer]   Social Drivers of Health   Financial Resource Strain: Not on file  Food Insecurity: Not on file  Transportation Needs: Not on file  Physical Activity: Not on file  Stress: Not on file  Social Connections: Not on file  Intimate Partner Violence: Not on file    Past Medical History, Surgical history, Social history, and Family history were reviewed and updated as appropriate.   Please see review of systems for further details on the patient's review from today.   Objective:   Physical Exam:  LMP  07/23/2023   Physical Exam Constitutional:      General: She is not in acute distress. Musculoskeletal:        General: No deformity.  Neurological:     Mental Status: She is alert and oriented to person, place, and time.     Coordination: Coordination normal.  Psychiatric:        Attention and Perception: Attention and perception normal. She does not perceive auditory or visual hallucinations.        Mood and Affect: Mood normal. Mood is not anxious or depressed. Affect is not labile, blunt, angry or inappropriate.        Speech: Speech normal.        Behavior: Behavior normal.        Thought Content: Thought content normal. Thought content is not paranoid or delusional.  Thought content does not include homicidal or suicidal ideation. Thought content does not include homicidal or suicidal plan.        Cognition and Memory: Cognition and memory normal.        Judgment: Judgment normal.     Comments: Insight intact     Lab Review:     Component Value Date/Time   NA 139 07/11/2020 1738   K 3.4 (L) 07/11/2020 1738   CL 106 07/11/2020 1738   CO2 25 07/11/2020 1738   GLUCOSE 100 (H) 07/11/2020 1738   BUN 8 07/11/2020 1738   CREATININE 0.68 07/11/2020 1738   CALCIUM 9.9 07/11/2020 1738   GFRNONAA 128 02/23/2022 1511       Component Value Date/Time   WBC 5.4 07/11/2020 1738   RBC 4.24 07/11/2020 1738   HGB 13.0 07/11/2020 1738   HCT 37.8 07/11/2020 1738   PLT 207 07/11/2020 1738   MCV 89.2 07/11/2020 1738   MCH 30.7 07/11/2020 1738   MCHC 34.4 07/11/2020 1738   RDW 12.0 07/11/2020 1738   LYMPHSABS 2.2 05/23/2020 0828   MONOABS 0.4 05/23/2020 0828   EOSABS 0.2 05/23/2020 0828   BASOSABS 0.1 05/23/2020 0828    No results found for: "POCLITH", "LITHIUM"   No results found for: "PHENYTOIN", "PHENOBARB", "VALPROATE", "CBMZ"   .res Assessment: Plan:    Plan:  PDMP reviewed  1. Adderall 30mg  daily. 2. Propanolol 10mg  BID for anxiety  Monitor BP between visits  while taking stimulant medication - 127/80.   40/58 Attention Deficit Disorder likely  Meets DSM 5 criteria for ADHD  RTC 3 months  20 minutes spent dedicated to the care of this patient on the date of this encounter to include pre-visit review of records, ordering of medication, post visit documentation, and face-to-face time with the patient discussing MDD, ADHD, and GAD. Discussed continuing current medication regimen.  Patient advised to contact office with any questions, adverse effects, or acute worsening in signs and symptoms.  Discussed potential benefits, risks, and side effects of stimulants with patient to include increased heart rate, palpitations, insomnia, increased anxiety, increased irritability, or decreased appetite.  Instructed patient to contact office if experiencing any significant tolerability issues.   There are no diagnoses linked to this encounter.   Please see After Visit Summary for patient specific instructions.  Future Appointments  Date Time Provider Department Center  08/10/2023  2:00 PM Nielle Duford Nattalie, NP [facility] None    No orders of the defined types were placed in this encounter.     -------------------------------

## 2023-11-10 ENCOUNTER — Other Ambulatory Visit: Payer: Self-pay

## 2023-11-10 ENCOUNTER — Telehealth: Payer: Self-pay | Admitting: Adult Health

## 2023-11-10 DIAGNOSIS — F909 Attention-deficit hyperactivity disorder, unspecified type: Secondary | ICD-10-CM

## 2023-11-10 MED ORDER — AMPHETAMINE-DEXTROAMPHETAMINE 30 MG PO TABS
30.0000 mg | ORAL_TABLET | Freq: Every day | ORAL | 0 refills | Status: DC
Start: 2023-11-10 — End: 2023-11-24

## 2023-11-10 NOTE — Telephone Encounter (Signed)
 Has appt for 9//10

## 2023-11-10 NOTE — Telephone Encounter (Signed)
 Pended

## 2023-11-10 NOTE — Telephone Encounter (Signed)
 Pt is due for FU this month. Will wait until appt scheduled to send Rx.

## 2023-11-10 NOTE — Telephone Encounter (Signed)
 Pt LVM for refill of Adderall 30mg  to   [facility] [id number],  - [address] [place], [place] [address] Phone: [phone]  Fax: [phone]   I called back to advise her she needs to call back and schedule an appt.  It was due this month.

## 2023-11-10 NOTE — Telephone Encounter (Signed)
 Casey Santana's next appt is 11/24/23. Requesting refill for Adderall 30 mg called to:  [facility] [id number], [place] - [address] [place], [place] [address] Phone: [phone]  Fax: [phone]

## 2023-11-24 ENCOUNTER — Telehealth: Admitting: Adult Health

## 2023-11-24 ENCOUNTER — Encounter: Payer: Self-pay | Admitting: Adult Health

## 2023-11-24 DIAGNOSIS — F909 Attention-deficit hyperactivity disorder, unspecified type: Secondary | ICD-10-CM

## 2023-11-24 DIAGNOSIS — F411 Generalized anxiety disorder: Secondary | ICD-10-CM

## 2023-11-24 DIAGNOSIS — F331 Major depressive disorder, recurrent, moderate: Secondary | ICD-10-CM | POA: Diagnosis not present

## 2023-11-24 MED ORDER — AMPHETAMINE-DEXTROAMPHETAMINE 30 MG PO TABS: 30.0000 mg | ORAL_TABLET | Freq: Every day | ORAL | 0 refills | Status: DC

## 2023-11-24 MED ORDER — PROPRANOLOL HCL 10 MG PO TABS: 10.0000 mg | ORAL_TABLET | Freq: Two times a day (BID) | ORAL | 1 refills | Status: DC

## 2023-11-24 NOTE — Progress Notes (Signed)
 Casey Santana [id number] 11-03-2000 22 y.o.  Virtual Visit via Video Note  I connected with pt @ on 11/24/23 at  4:30 PM EDT by a video enabled telemedicine application and verified that I am speaking with the correct person using two identifiers.   I discussed the limitations of evaluation and management by telemedicine and the availability of in person appointments. The patient expressed understanding and agreed to proceed.  I discussed the assessment and treatment plan with the patient. The patient was provided an opportunity to ask questions and all were answered. The patient agreed with the plan and demonstrated an understanding of the instructions.   The patient was advised to call back or seek an in-person evaluation if the symptoms worsen or if the condition fails to improve as anticipated.  I provided 15 minutes of non-face-to-face time during this encounter.  The patient was located at home.  The provider was located at [facility].   Casey LOISE Sayers, NP   Subjective:   Patient ID:  Casey Santana is a 23 y.o. (DOB June 07, 2000) adult.  Chief Complaint: No chief complaint on file.   HPI Casey Santana presents for follow-up of MDD, ADHD and GAD.  Describes mood today as ok. Pleasant. Denies tearful. Mood symptoms - denies anxiety, irritability and depression. Reports stable interest and motivation. Denies recent panic attacks. Denies worry, rumination and over thinking. Reports mood is stable. Stating I feel like I'm doing ok.Taking medications as prescribed.  Energy levels stable. Active, does not have a regular exercise routine - walking. Enjoys some usual interests and activities. Lives in [place] with boyfriend and cat Casey Santana. Reports going to school full time and working a part time job. Parents in [place]. Has a brother - living in [place] . Spending time with family.  Appetite adequate. Weight stable - 135 to 140 pounds. Sleeps well most  nights. Averages 6 to 7.5 hours. Focus and concentration stable. Completing tasks. Managing aspects of household. Attending [employer]. Denies SI or HI.  Denies AH or VH.  Denies self harm.  Denies substance use.  Previous medication trials: Lexapro , Prozac , Propanolol, Cymbalta , Wellbutrin  XL, Abilify , Pristiq   Review of Systems:  Review of Systems  Musculoskeletal:  Negative for gait problem.  Neurological:  Negative for tremors.  Psychiatric/Behavioral:         Please refer to HPI    Medications: I have reviewed the patient's current medications.  Current Outpatient Medications  Medication Sig Dispense Refill   amphetamine -dextroamphetamine  (ADDERALL) 30 MG tablet Take 1 tablet by mouth daily. 30 tablet 0   amphetamine -dextroamphetamine  (ADDERALL) 30 MG tablet Take 1 tablet by mouth daily. 30 tablet 0   amphetamine -dextroamphetamine  (ADDERALL) 30 MG tablet Take 1 tablet by mouth daily. 30 tablet 0   EPINEPHrine  (EPIPEN  2-PAK) 0.3 mg/0.3 mL IJ SOAJ injection Inject 0.3 mg into the muscle as needed for anaphylaxis. 1 each 2   etonogestrel  (NEXPLANON ) 68 MG IMPL implant 1 each by Subdermal route once.     ibuprofen  (ADVIL ) 800 MG tablet Take 1 tablet (800 mg total) by mouth every 8 (eight) hours as needed for pain 60 tablet 3   metoprolol  tartrate (LOPRESSOR ) 25 MG tablet Take 1 tablet (25 mg total) by mouth 2 (two) times daily. 60 tablet 3   propranolol  (INDERAL ) 10 MG tablet Take 1 tablet (10 mg total) by mouth 2 (two) times daily. 180 tablet 1   No current facility-administered medications for this visit.    Medication Side Effects: None  Allergies:  Allergies  Allergen Reactions   Avocado    Peanut Allergen Powder-Dnfp    Peanut-Containing Drug Products     Past Medical History:  Diagnosis Date   Anxiety    Autoimmune disorder (HCC)    Depression    Dysmenorrhea    Heart murmur    Trichotillomania     Family History  Problem Relation Age of Onset   Arthritis  Mother    Bipolar disorder Mother    Hyperlipidemia Father    Healthy Brother    Arthritis Maternal Grandmother    Hearing loss Maternal Grandmother    Hyperlipidemia Maternal Grandmother    Hypertension Maternal Grandmother    Anxiety disorder Maternal Grandmother    Arthritis Maternal Grandfather    Cancer Maternal Grandfather    Depression Maternal Grandfather    Hearing loss Maternal Grandfather    Hyperlipidemia Maternal Grandfather    Other Maternal Grandfather        back pain   Hyperlipidemia Paternal Grandmother    Alzheimer's disease Paternal Grandmother    Hyperlipidemia Paternal Grandfather    Bipolar disorder Other    Schizophrenia Other    Migraines Neg Hx    Seizures Neg Hx    Autism Neg Hx    ADD / ADHD Neg Hx     Social History   Socioeconomic History   Marital status: Single    Spouse name: Not on file   Number of children: Not on file   Years of education: Not on file   Highest education level: Not on file  Occupational History   Not on file  Tobacco Use   Smoking status: Never    Passive exposure: Never   Smokeless tobacco: Never  Vaping Use   Vaping status: Never Used  Substance and Sexual Activity   Alcohol use: No   Drug use: No   Sexual activity: Yes    Birth control/protection: Implant  Other Topics Concern   Not on file  Social History Narrative   ** Merged History Encounter **       Lives with mom, dad and brother when he comes home from college. She plans on attending [employer]   Social Drivers of Health   Financial Resource Strain: Not on file  Food Insecurity: Not on file  Transportation Needs: Not on file  Physical Activity: Not on file  Stress: Not on file  Social Connections: Not on file  Intimate Partner Violence: Not on file    Past Medical History, Surgical history, Social history, and Family history were reviewed and updated as appropriate.   Please see review of systems for further details on the patient's  review from today.   Objective:   Physical Exam:  There were no vitals taken for this visit.  Physical Exam Constitutional:      General: She is not in acute distress. Musculoskeletal:        General: No deformity.  Neurological:     Mental Status: She is alert and oriented to person, place, and time.     Coordination: Coordination normal.  Psychiatric:        Attention and Perception: Attention and perception normal. She does not perceive auditory or visual hallucinations.        Mood and Affect: Mood normal. Mood is not anxious or depressed. Affect is not labile, blunt, angry or inappropriate.        Speech: Speech normal.        Behavior: Behavior normal.  Thought Content: Thought content normal. Thought content is not paranoid or delusional. Thought content does not include homicidal or suicidal ideation. Thought content does not include homicidal or suicidal plan.        Cognition and Memory: Cognition and memory normal.        Judgment: Judgment normal.     Comments: Insight intact     Lab Review:     Component Value Date/Time   NA 139 07/11/2020 1738   K 3.4 (L) 07/11/2020 1738   CL 106 07/11/2020 1738   CO2 25 07/11/2020 1738   GLUCOSE 100 (H) 07/11/2020 1738   BUN 8 07/11/2020 1738   CREATININE 0.68 07/11/2020 1738   CALCIUM 9.9 07/11/2020 1738   GFRNONAA 128 02/23/2022 1511       Component Value Date/Time   WBC 5.4 07/11/2020 1738   RBC 4.24 07/11/2020 1738   HGB 13.0 07/11/2020 1738   HCT 37.8 07/11/2020 1738   PLT 207 07/11/2020 1738   MCV 89.2 07/11/2020 1738   MCH 30.7 07/11/2020 1738   MCHC 34.4 07/11/2020 1738   RDW 12.0 07/11/2020 1738   LYMPHSABS 2.2 05/23/2020 0828   MONOABS 0.4 05/23/2020 0828   EOSABS 0.2 05/23/2020 0828   BASOSABS 0.1 05/23/2020 0828    No results found for: POCLITH, LITHIUM   No results found for: PHENYTOIN, PHENOBARB, VALPROATE, CBMZ   .res Assessment: Plan:    Plan:  PDMP reviewed  1.  Adderall 30mg  daily. 2. Propanolol 10mg  BID for anxiety  Monitor BP between visits while taking stimulant medication.  40/58 Attention Deficit Disorder likely  Meets DSM 5 criteria for ADHD  RTC 3 months  15 minutes spent dedicated to the care of this patient on the date of this encounter to include pre-visit review of records, ordering of medication, post visit documentation, and face-to-face time with the patient discussing MDD, ADHD, and GAD. Discussed continuing current medication regimen.  Patient advised to contact office with any questions, adverse effects, or acute worsening in signs and symptoms.  Discussed potential benefits, risks, and side effects of stimulants with patient to include increased heart rate, palpitations, insomnia, increased anxiety, increased irritability, or decreased appetite.  Instructed patient to contact office if experiencing any significant tolerability issues.   There are no diagnoses linked to this encounter.   Please see After Visit Summary for patient specific instructions.  Future Appointments  Date Time Provider Department Center  11/24/2023  4:30 PM Maudy Yonan Nattalie, NP [facility] None    No orders of the defined types were placed in this encounter.     -------------------------------

## 2023-12-02 ENCOUNTER — Other Ambulatory Visit: Payer: Self-pay | Admitting: Adult Health

## 2023-12-02 DIAGNOSIS — F411 Generalized anxiety disorder: Secondary | ICD-10-CM

## 2024-02-14 ENCOUNTER — Telehealth: Admitting: Adult Health

## 2024-02-14 ENCOUNTER — Encounter: Payer: Self-pay | Admitting: Adult Health

## 2024-02-14 DIAGNOSIS — F331 Major depressive disorder, recurrent, moderate: Secondary | ICD-10-CM | POA: Diagnosis not present

## 2024-02-14 DIAGNOSIS — F909 Attention-deficit hyperactivity disorder, unspecified type: Secondary | ICD-10-CM

## 2024-02-14 DIAGNOSIS — F411 Generalized anxiety disorder: Secondary | ICD-10-CM | POA: Diagnosis not present

## 2024-02-14 MED ORDER — PROPRANOLOL HCL 10 MG PO TABS: 10.0000 mg | ORAL_TABLET | Freq: Two times a day (BID) | ORAL | 1 refills | Status: AC

## 2024-02-14 MED ORDER — AMPHETAMINE-DEXTROAMPHETAMINE 30 MG PO TABS: 30.0000 mg | ORAL_TABLET | Freq: Every day | ORAL | 0 refills | Status: AC

## 2024-02-14 MED ORDER — AMPHETAMINE-DEXTROAMPHETAMINE 30 MG PO TABS: 30.0000 mg | ORAL_TABLET | Freq: Every day | ORAL | 0 refills | Status: DC

## 2024-02-14 NOTE — Progress Notes (Signed)
 Casey Santana [id number] November 28, 2000 23 y.o.  Virtual Visit via Video Note  I connected with pt @ on 02/14/24 at  4:00 PM EST by a video enabled telemedicine application and verified that I am speaking with the correct person using two identifiers.   I discussed the limitations of evaluation and management by telemedicine and the availability of in person appointments. The patient expressed understanding and agreed to proceed.  I discussed the assessment and treatment plan with the patient. The patient was provided an opportunity to ask questions and all were answered. The patient agreed with the plan and demonstrated an understanding of the instructions.   The patient was advised to call back or seek an in-person evaluation if the symptoms worsen or if the condition fails to improve as anticipated.  I provided 15 minutes of non-face-to-face time during this encounter.  The patient was located at home.  The provider was located at [facility].   Casey LOISE Sayers, NP   Subjective:   Patient ID:  Casey Santana is a 23 y.o. (DOB 2000/10/27) adult.  Chief Complaint: No chief complaint on file.   HPI Casey Santana presents for follow-up of MDD, ADHD and GAD.  Describes mood today as ok. Pleasant. Denies tearful. Mood symptoms - reports some anxiety and irritability. Denies depression. Reports stable interest and motivation. Denies recent panic attacks. Denies worry, rumination and over thinking. Reports mood is stable. Stating I feel like I'm doing alright.Taking medications as prescribed.  Energy levels stable. Active, does not have a regular exercise routine - walking. Enjoys some usual interests and activities. Lives in [place] with boyfriend and cat Mellow. Parents in [place]. Has a brother - living in [place] . Spending time with family.  Appetite adequate. Weight stable - 135 to 140 pounds. Sleeps well most nights. Averages 7 hours. Focus and  concentration stable. Completing tasks. Managing aspects of household. Working part time - 15 hours a week. Attending [employer]. Denies SI or HI.  Denies AH or VH.  Denies self harm.  Denies substance use.  Previous medication trials: Lexapro , Prozac , Propanolol, Cymbalta , Wellbutrin  XL, Abilify , Pristiq    Review of Systems:  Review of Systems  Musculoskeletal:  Negative for gait problem.  Neurological:  Negative for tremors.  Psychiatric/Behavioral:         Please refer to HPI    Medications: I have reviewed the patient's current medications.  Current Outpatient Medications  Medication Sig Dispense Refill   amphetamine -dextroamphetamine  (ADDERALL) 30 MG tablet Take 1 tablet by mouth daily. 30 tablet 0   amphetamine -dextroamphetamine  (ADDERALL) 30 MG tablet Take 1 tablet by mouth daily. 30 tablet 0   amphetamine -dextroamphetamine  (ADDERALL) 30 MG tablet Take 1 tablet by mouth daily. 30 tablet 0   EPINEPHrine  (EPIPEN  2-PAK) 0.3 mg/0.3 mL IJ SOAJ injection Inject 0.3 mg into the muscle as needed for anaphylaxis. 1 each 2   etonogestrel  (NEXPLANON ) 68 MG IMPL implant 1 each by Subdermal route once.     ibuprofen  (ADVIL ) 800 MG tablet Take 1 tablet (800 mg total) by mouth every 8 (eight) hours as needed for pain 60 tablet 3   metoprolol  tartrate (LOPRESSOR ) 25 MG tablet Take 1 tablet (25 mg total) by mouth 2 (two) times daily. 60 tablet 3   propranolol  (INDERAL ) 10 MG tablet Take 1 tablet (10 mg total) by mouth 2 (two) times daily. 180 tablet 1   No current facility-administered medications for this visit.    Medication Side Effects: None  Allergies:  Allergies  Allergen Reactions   Avocado    Peanut Allergen Powder-Dnfp    Peanut-Containing Drug Products     Past Medical History:  Diagnosis Date   Anxiety    Autoimmune disorder    Depression    Dysmenorrhea    Heart murmur    Trichotillomania     Family History  Problem Relation Age of Onset   Arthritis Mother     Bipolar disorder Mother    Hyperlipidemia Father    Healthy Brother    Arthritis Maternal Grandmother    Hearing loss Maternal Grandmother    Hyperlipidemia Maternal Grandmother    Hypertension Maternal Grandmother    Anxiety disorder Maternal Grandmother    Arthritis Maternal Grandfather    Cancer Maternal Grandfather    Depression Maternal Grandfather    Hearing loss Maternal Grandfather    Hyperlipidemia Maternal Grandfather    Other Maternal Grandfather        back pain   Hyperlipidemia Paternal Grandmother    Alzheimer's disease Paternal Grandmother    Hyperlipidemia Paternal Grandfather    Bipolar disorder Other    Schizophrenia Other    Migraines Neg Hx    Seizures Neg Hx    Autism Neg Hx    ADD / ADHD Neg Hx     Social History   Socioeconomic History   Marital status: Single    Spouse name: Not on file   Number of children: Not on file   Years of education: Not on file   Highest education level: Not on file  Occupational History   Not on file  Tobacco Use   Smoking status: Never    Passive exposure: Never   Smokeless tobacco: Never  Vaping Use   Vaping status: Never Used  Substance and Sexual Activity   Alcohol use: No   Drug use: No   Sexual activity: Yes    Birth control/protection: Implant  Other Topics Concern   Not on file  Social History Narrative   ** Merged History Encounter **       Lives with mom, dad and brother when he comes home from college. She plans on attending [employer]   Social Drivers of Health   Financial Resource Strain: Not on file  Food Insecurity: Not on file  Transportation Needs: Not on file  Physical Activity: Not on file  Stress: Not on file  Social Connections: Not on file  Intimate Partner Violence: Not on file    Past Medical History, Surgical history, Social history, and Family history were reviewed and updated as appropriate.   Please see review of systems for further details on the patient's review from  today.   Objective:   Physical Exam:  There were no vitals taken for this visit.  Physical Exam Constitutional:      General: She is not in acute distress. Musculoskeletal:        General: No deformity.  Neurological:     Mental Status: She is alert and oriented to person, place, and time.     Coordination: Coordination normal.  Psychiatric:        Attention and Perception: Attention and perception normal. She does not perceive auditory or visual hallucinations.        Mood and Affect: Mood normal. Mood is not anxious or depressed. Affect is not labile, blunt, angry or inappropriate.        Speech: Speech normal.        Behavior: Behavior normal.  Thought Content: Thought content normal. Thought content is not paranoid or delusional. Thought content does not include homicidal or suicidal ideation. Thought content does not include homicidal or suicidal plan.        Cognition and Memory: Cognition and memory normal.        Judgment: Judgment normal.     Comments: Insight intact     Lab Review:     Component Value Date/Time   NA 139 07/11/2020 1738   K 3.4 (L) 07/11/2020 1738   CL 106 07/11/2020 1738   CO2 25 07/11/2020 1738   GLUCOSE 100 (H) 07/11/2020 1738   BUN 8 07/11/2020 1738   CREATININE 0.68 07/11/2020 1738   CALCIUM 9.9 07/11/2020 1738   GFRNONAA 128 02/23/2022 1511       Component Value Date/Time   WBC 5.4 07/11/2020 1738   RBC 4.24 07/11/2020 1738   HGB 13.0 07/11/2020 1738   HCT 37.8 07/11/2020 1738   PLT 207 07/11/2020 1738   MCV 89.2 07/11/2020 1738   MCH 30.7 07/11/2020 1738   MCHC 34.4 07/11/2020 1738   RDW 12.0 07/11/2020 1738   LYMPHSABS 2.2 05/23/2020 0828   MONOABS 0.4 05/23/2020 0828   EOSABS 0.2 05/23/2020 0828   BASOSABS 0.1 05/23/2020 0828    No results found for: POCLITH, LITHIUM   No results found for: PHENYTOIN, PHENOBARB, VALPROATE, CBMZ   .res Assessment: Plan:   Plan:  PDMP reviewed  1. Adderall 30mg   daily. 2. Propanolol 10mg  BID for anxiety  Monitor BP between visits while taking stimulant medication.  40/58 Attention Deficit Disorder likely  Meets DSM 5 criteria for ADHD  RTC 3 months  15 minutes spent dedicated to the care of this patient on the date of this encounter to include pre-visit review of records, ordering of medication, post visit documentation, and face-to-face time with the patient discussing MDD, ADHD, and GAD. Discussed continuing current medication regimen.  Patient advised to contact office with any questions, adverse effects, or acute worsening in signs and symptoms.  Discussed potential benefits, risks, and side effects of stimulants with patient to include increased heart rate, palpitations, insomnia, increased anxiety, increased irritability, or decreased appetite.  Instructed patient to contact office if experiencing any significant tolerability issues.   There are no diagnoses linked to this encounter.   Please see After Visit Summary for patient specific instructions.  Future Appointments  Date Time Provider Department Center  02/14/2024  4:00 PM Luchiano Viscomi Nattalie, NP [facility] None    No orders of the defined types were placed in this encounter.     -------------------------------

## 2024-03-13 ENCOUNTER — Telehealth: Payer: Self-pay | Admitting: Adult Health

## 2024-03-13 ENCOUNTER — Other Ambulatory Visit: Payer: Self-pay

## 2024-03-13 DIAGNOSIS — F909 Attention-deficit hyperactivity disorder, unspecified type: Secondary | ICD-10-CM

## 2024-03-13 MED ORDER — AMPHETAMINE-DEXTROAMPHETAMINE 30 MG PO TABS: 30.0000 mg | ORAL_TABLET | Freq: Every day | ORAL | 0 refills | Status: AC

## 2024-03-13 NOTE — Telephone Encounter (Signed)
 Canceled Rx for start date 12/29 in [place] and repended to [facility] on [employer]

## 2024-03-13 NOTE — Telephone Encounter (Signed)
 Canceled at [facility] in [place] and repended to [facility] on [employer]

## 2024-03-13 NOTE — Telephone Encounter (Signed)
" °  Pt needs Adderall script for this month sent to diff phaarm   [facility] [address] "

## 2024-05-15 ENCOUNTER — Telehealth: Admitting: Adult Health
